# Patient Record
Sex: Female | Born: 1980
Health system: Southern US, Community
[De-identification: ages and names within clinical notes are randomized; demographics above are authoritative.]

## PROBLEM LIST (undated history)

## (undated) DIAGNOSIS — F329 Major depressive disorder, single episode, unspecified: Secondary | ICD-10-CM

## (undated) DIAGNOSIS — F32A Depression, unspecified: Secondary | ICD-10-CM

## (undated) DIAGNOSIS — F988 Other specified behavioral and emotional disorders with onset usually occurring in childhood and adolescence: Secondary | ICD-10-CM

## (undated) HISTORY — DX: Major depressive disorder, single episode, unspecified: F32.9

## (undated) HISTORY — DX: Other specified behavioral and emotional disorders with onset usually occurring in childhood and adolescence: F98.8

## (undated) HISTORY — DX: Depression, unspecified: F32.A

---
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
## 2015-08-06 DIAGNOSIS — Z01419 Encounter for gynecological examination (general) (routine) without abnormal findings: Secondary | ICD-10-CM | POA: Diagnosis not present

## 2015-08-30 DIAGNOSIS — J329 Chronic sinusitis, unspecified: Secondary | ICD-10-CM | POA: Diagnosis not present

## 2015-09-06 DIAGNOSIS — H10413 Chronic giant papillary conjunctivitis, bilateral: Secondary | ICD-10-CM | POA: Diagnosis not present

## 2016-02-25 ENCOUNTER — Encounter: Payer: Self-pay | Admitting: Physician Assistant

## 2016-02-25 ENCOUNTER — Encounter (INDEPENDENT_AMBULATORY_CARE_PROVIDER_SITE_OTHER): Payer: Self-pay

## 2016-02-25 ENCOUNTER — Ambulatory Visit (INDEPENDENT_AMBULATORY_CARE_PROVIDER_SITE_OTHER): Payer: Medicare Other | Admitting: Physician Assistant

## 2016-02-25 VITALS — BP 129/92 | HR 86 | Temp 98.4°F | Ht 60.0 in | Wt 145.6 lb

## 2016-02-25 DIAGNOSIS — N946 Dysmenorrhea, unspecified: Secondary | ICD-10-CM

## 2016-02-25 DIAGNOSIS — F988 Other specified behavioral and emotional disorders with onset usually occurring in childhood and adolescence: Secondary | ICD-10-CM | POA: Insufficient documentation

## 2016-02-25 DIAGNOSIS — F329 Major depressive disorder, single episode, unspecified: Secondary | ICD-10-CM

## 2016-02-25 DIAGNOSIS — F32A Depression, unspecified: Secondary | ICD-10-CM | POA: Insufficient documentation

## 2016-02-25 MED ORDER — METHYLPHENIDATE HCL ER (OSM) 18 MG PO TBCR: 18.0000 mg | EXTENDED_RELEASE_TABLET | Freq: Every day | ORAL | 0 refills | Status: DC

## 2016-02-25 NOTE — Patient Instructions (Signed)

## 2016-02-25 NOTE — Progress Notes (Signed)
BP (!) 129/92   Pulse 86   Temp 98.4 F (36.9 C) (Oral)   Ht 5' (1.524 m)   Wt 145 lb 9.6 oz (66 kg)   BMI 28.44 kg/m    Subjective:    Patient ID: Kayla Shepherd, female    DOB: 1980/06/27, 35 y.o.   MRN: [id number]  Kayla Shepherd is a 35 y.o. female presenting on 02/25/2016 for Medication follow up  HPI Patient here to be established as new patient at [facility].  This patient is known to me from [facility]. This patient comes in for recheck on her medications. Overall she is doing well. She is known to me from previous office. She is not having any complaints today. All of her medications and conditions are reviewed. She'll be going out of town for the next 2 days and like to return next week to have her flu shot. That is fine to come back in and have it done.   Relevant past medical, surgical, family and social history reviewed and updated as indicated. Interim medical history since our last visit reviewed. Allergies and medications reviewed and updated.   Data reviewed from any sources in EPIC.  Review of Systems  Constitutional: Negative.  Negative for activity change, fatigue and fever.  HENT: Negative.   Eyes: Negative.   Respiratory: Negative.  Negative for cough.   Cardiovascular: Negative.  Negative for chest pain.  Gastrointestinal: Negative.  Negative for abdominal pain.  Endocrine: Negative.   Genitourinary: Negative.  Negative for dysuria.  Musculoskeletal: Negative.   Skin: Negative.   Neurological: Negative.   Psychiatric/Behavioral: Positive for decreased concentration and dysphoric mood. The patient is nervous/anxious.      Social History   Social History  . Marital status: Single    Spouse name: N/A  . Number of children: N/A  . Years of education: N/A   Occupational History  . Not on file.   Social History Main Topics  . Smoking status: Never Smoker  . Smokeless tobacco: Never Used  . Alcohol use No    . Drug use: No  . Sexual activity: Not on file   Other Topics Concern  . Not on file   Social History Narrative  . No narrative on file    History reviewed. No pertinent surgical history.  History reviewed. No pertinent family history.    Medication List       Accurate as of 02/25/16 10:02 PM. Always use your most recent med list.          buPROPion 300 MG 24 hr tablet Commonly known as:  WELLBUTRIN XL   levonorgestrel-ethinyl estradiol 90-20 MCG tablet Commonly known as:  LYBREL,AMETHYST   LORazepam 0.5 MG tablet Commonly known as:  ATIVAN Take 0.5 mg by mouth 2 (two) times daily as needed for anxiety.   methylphenidate 18 MG CR tablet Commonly known as:  CONCERTA Take 1 tablet (18 mg total) by mouth daily.   methylphenidate 18 MG CR tablet Commonly known as:  CONCERTA Take 1 tablet (18 mg total) by mouth daily.   methylphenidate 18 MG CR tablet Commonly known as:  CONCERTA Take 1 tablet (18 mg total) by mouth daily.          Objective:    BP (!) 129/92   Pulse 86   Temp 98.4 F (36.9 C) (Oral)   Ht 5' (1.524 m)   Wt 145 lb 9.6 oz (66 kg)   BMI  28.44 kg/m   No Known Allergies Wt Readings from Last 3 Encounters:  02/25/16 145 lb 9.6 oz (66 kg)    Physical Exam  Constitutional: She is oriented to person, place, and time. She appears well-developed and well-nourished.  HENT:  Head: Normocephalic and atraumatic.  Eyes: Conjunctivae and EOM are normal. Pupils are equal, round, and reactive to light.  Neck: Normal range of motion. Neck supple.  Cardiovascular: Normal rate, regular rhythm, normal heart sounds and intact distal pulses.   Pulmonary/Chest: Effort normal and breath sounds normal.  Abdominal: Soft. Bowel sounds are normal.  Neurological: She is alert and oriented to person, place, and time. She has normal reflexes.  Skin: Skin is warm and dry. No rash noted.  Psychiatric: She has a normal mood and affect. Her behavior is normal.  Judgment and thought content normal.      Assessment & Plan:   1. Attention deficit disorder, unspecified hyperactivity presence - methylphenidate 18 MG PO CR tablet; Take 1 tablet (18 mg total) by mouth daily.  Dispense: 30 tablet; Refill: 0 - methylphenidate (CONCERTA) 18 MG PO CR tablet; Take 1 tablet (18 mg total) by mouth daily.  Dispense: 30 tablet; Refill: 0 - methylphenidate (CONCERTA) 18 MG PO CR tablet; Take 1 tablet (18 mg total) by mouth daily.  Dispense: 30 tablet; Refill: 0  2. Depression, unspecified depression type  3. Dysmenorrhea Continue Lybrel   Continue all other maintenance medications as listed above. Educational handout given for ADHD  Follow up plan: Return in about 3 months (around 05/27/2016).  Terald Sleeper PA-C [facility] [address]  [place], [place] [address] [phone]   02/25/2016, 10:02 PM

## 2016-03-26 ENCOUNTER — Ambulatory Visit (INDEPENDENT_AMBULATORY_CARE_PROVIDER_SITE_OTHER): Payer: Medicare Other

## 2016-03-26 DIAGNOSIS — Z23 Encounter for immunization: Secondary | ICD-10-CM | POA: Diagnosis not present

## 2016-07-04 ENCOUNTER — Other Ambulatory Visit: Payer: Self-pay | Admitting: Physician Assistant

## 2016-07-04 DIAGNOSIS — F988 Other specified behavioral and emotional disorders with onset usually occurring in childhood and adolescence: Secondary | ICD-10-CM

## 2016-07-09 ENCOUNTER — Encounter: Payer: Self-pay | Admitting: Physician Assistant

## 2016-07-09 ENCOUNTER — Ambulatory Visit (INDEPENDENT_AMBULATORY_CARE_PROVIDER_SITE_OTHER): Payer: Medicare Other | Admitting: Physician Assistant

## 2016-07-09 VITALS — BP 115/89 | HR 98 | Temp 97.4°F | Ht 60.0 in | Wt 141.2 lb

## 2016-07-09 DIAGNOSIS — F901 Attention-deficit hyperactivity disorder, predominantly hyperactive type: Secondary | ICD-10-CM

## 2016-07-09 DIAGNOSIS — F419 Anxiety disorder, unspecified: Secondary | ICD-10-CM | POA: Insufficient documentation

## 2016-07-09 DIAGNOSIS — F988 Other specified behavioral and emotional disorders with onset usually occurring in childhood and adolescence: Secondary | ICD-10-CM

## 2016-07-09 MED ORDER — LORAZEPAM 0.5 MG PO TABS: 0.5000 mg | ORAL_TABLET | Freq: Two times a day (BID) | ORAL | 5 refills | Status: DC | PRN

## 2016-07-09 MED ORDER — METHYLPHENIDATE HCL ER (OSM) 18 MG PO TBCR: 18.0000 mg | EXTENDED_RELEASE_TABLET | Freq: Every day | ORAL | 0 refills | Status: DC

## 2016-07-09 NOTE — Patient Instructions (Signed)
In a few days you may receive a survey in the mail or online from [employer] regarding your visit with us today. Please take a moment to fill this out. Your feedback is very important to our whole office. It can help us better understand your needs as well as improve your experience and satisfaction. Thank you for taking your time to complete it. We care about you.  Melannie Metzner, PA-C  

## 2016-07-10 NOTE — Progress Notes (Signed)
BP 115/89   Pulse 98   Temp 97.4 F (36.3 C) (Oral)   Ht 5' (1.524 m)   Wt 141 lb 3.2 oz (64 kg)   BMI 27.58 kg/m    Subjective:    Patient ID: Kayla Shepherd, female    DOB: [birth year], 36 y.o.   MRN: [id number]  HPI: Kayla Shepherd is a 36 y.o. female presenting on 07/09/2016 for Medication Refill  This patient comes in for periodic recheck on medications and conditions including ADHD, anxiety.   All medications are reviewed today. There are no reports of any problems with the medications. All of the medical conditions are reviewed and updated.  Lab work is reviewed and will be ordered as medically necessary. There are no new problems reported with today's visit.   Relevant past medical, surgical, family and social history reviewed and updated as indicated. Allergies and medications reviewed and updated.  Past Medical History:  Diagnosis Date  . ADD (attention deficit disorder)   . Depression     History reviewed. No pertinent surgical history.  Review of Systems  Constitutional: Negative.  Negative for activity change, fatigue and fever.  HENT: Negative.   Eyes: Negative.   Respiratory: Negative.  Negative for cough.   Cardiovascular: Negative.  Negative for chest pain.  Gastrointestinal: Negative.  Negative for abdominal pain.  Endocrine: Negative.   Genitourinary: Negative.  Negative for dysuria.  Musculoskeletal: Negative.   Skin: Negative.   Neurological: Negative.     Allergies as of 07/09/2016   No Known Allergies     Medication List       Accurate as of 07/09/16 11:59 PM. Always use your most recent med list.          buPROPion 300 MG 24 hr tablet Commonly known as:  WELLBUTRIN XL   levonorgestrel-ethinyl estradiol 90-20 MCG tablet Commonly known as:  LYBREL,AMETHYST   LORazepam 0.5 MG tablet Commonly known as:  ATIVAN Take 1 tablet (0.5 mg total) by mouth 2 (two) times daily as needed for anxiety.   methylphenidate 18 MG CR  tablet Commonly known as:  CONCERTA Take 1 tablet (18 mg total) by mouth daily.   methylphenidate 18 MG CR tablet Commonly known as:  CONCERTA Take 1 tablet (18 mg total) by mouth daily.   methylphenidate 18 MG CR tablet Commonly known as:  CONCERTA Take 1 tablet (18 mg total) by mouth daily.          Objective:    BP 115/89   Pulse 98   Temp 97.4 F (36.3 C) (Oral)   Ht 5' (1.524 m)   Wt 141 lb 3.2 oz (64 kg)   BMI 27.58 kg/m   No Known Allergies  Physical Exam  Constitutional: She is oriented to person, place, and time. She appears well-developed and well-nourished.  HENT:  Head: Normocephalic and atraumatic.  Right Ear: Tympanic membrane, external ear and ear canal normal.  Left Ear: Tympanic membrane, external ear and ear canal normal.  Nose: Nose normal. No rhinorrhea.  Mouth/Throat: Oropharynx is clear and moist and mucous membranes are normal. No oropharyngeal exudate or posterior oropharyngeal erythema.  Eyes: Conjunctivae and EOM are normal. Pupils are equal, round, and reactive to light.  Neck: Normal range of motion. Neck supple.  Cardiovascular: Normal rate, regular rhythm, normal heart sounds and intact distal pulses.   Pulmonary/Chest: Effort normal and breath sounds normal.  Abdominal: Soft. Bowel sounds are normal.  Neurological: She is alert and oriented to  person, place, and time. She has normal reflexes.  Skin: Skin is warm and dry. No rash noted.  Psychiatric: She has a normal mood and affect. Her behavior is normal. Judgment and thought content normal.    No results found for this or any previous visit.    Assessment & Plan:   1. Attention deficit disorder, unspecified hyperactivity presence - methylphenidate 18 MG PO CR tablet; Take 1 tablet (18 mg total) by mouth daily.  Dispense: 30 tablet; Refill: 0 - methylphenidate (CONCERTA) 18 MG PO CR tablet; Take 1 tablet (18 mg total) by mouth daily.  Dispense: 30 tablet; Refill: 0 - methylphenidate  (CONCERTA) 18 MG PO CR tablet; Take 1 tablet (18 mg total) by mouth daily.  Dispense: 30 tablet; Refill: 0  2. Anxiety - LORazepam (ATIVAN) 0.5 MG tablet; Take 1 tablet (0.5 mg total) by mouth 2 (two) times daily as needed for anxiety.  Dispense: 30 tablet; Refill: 5   Continue all other maintenance medications as listed above.  Follow up plan: Return in about 3 months (around 10/06/2016) for recheck meds.  Educational handout given for ADHD  Terald Sleeper PA-C [facility] [address]  [place], [place] [address] [phone]   07/10/2016, 1:10 PM

## 2016-07-16 ENCOUNTER — Other Ambulatory Visit: Payer: Self-pay | Admitting: Physician Assistant

## 2016-07-16 MED ORDER — DESOGESTREL-ETHINYL ESTRADIOL 0.15-0.02/0.01 MG (21/5) PO TABS: 1.0000 | ORAL_TABLET | Freq: Every day | ORAL | 11 refills | Status: DC

## 2016-07-16 NOTE — Progress Notes (Signed)
Patients mother aware  

## 2016-08-26 ENCOUNTER — Other Ambulatory Visit: Payer: Self-pay | Admitting: Physician Assistant

## 2016-09-29 ENCOUNTER — Other Ambulatory Visit: Payer: Self-pay | Admitting: Physician Assistant

## 2016-09-29 DIAGNOSIS — F419 Anxiety disorder, unspecified: Secondary | ICD-10-CM

## 2016-09-30 ENCOUNTER — Other Ambulatory Visit: Payer: Self-pay | Admitting: Physician Assistant

## 2016-09-30 DIAGNOSIS — F419 Anxiety disorder, unspecified: Secondary | ICD-10-CM

## 2016-09-30 MED ORDER — LORAZEPAM 0.5 MG PO TABS: ORAL_TABLET | ORAL | 2 refills | Status: DC

## 2016-09-30 NOTE — Progress Notes (Signed)
rx called into pharmacy

## 2016-10-07 ENCOUNTER — Ambulatory Visit (INDEPENDENT_AMBULATORY_CARE_PROVIDER_SITE_OTHER): Payer: Medicare Other | Admitting: Physician Assistant

## 2016-10-07 ENCOUNTER — Encounter: Payer: Self-pay | Admitting: Physician Assistant

## 2016-10-07 VITALS — BP 120/88 | HR 92 | Temp 98.1°F | Ht 60.0 in | Wt 141.8 lb

## 2016-10-07 DIAGNOSIS — F988 Other specified behavioral and emotional disorders with onset usually occurring in childhood and adolescence: Secondary | ICD-10-CM

## 2016-10-07 DIAGNOSIS — Z0000 Encounter for general adult medical examination without abnormal findings: Secondary | ICD-10-CM

## 2016-10-07 DIAGNOSIS — F908 Attention-deficit hyperactivity disorder, other type: Secondary | ICD-10-CM

## 2016-10-07 MED ORDER — BUPROPION HCL ER (XL) 300 MG PO TB24: 300.0000 mg | ORAL_TABLET | Freq: Every day | ORAL | 11 refills | Status: DC

## 2016-10-07 MED ORDER — METHYLPHENIDATE HCL ER (OSM) 18 MG PO TBCR: 18.0000 mg | EXTENDED_RELEASE_TABLET | Freq: Every day | ORAL | 0 refills | Status: DC

## 2016-10-07 NOTE — Progress Notes (Signed)
BP 120/88   Pulse 92   Temp 98.1 F (36.7 C) (Oral)   Ht 5' (1.524 m)   Wt 141 lb 12.8 oz (64.3 kg)   BMI 27.69 kg/m    Subjective:    Patient ID: Kayla Shepherd, female    DOB: 04-02-1981, 36 y.o.   MRN: [id number]  HPI: Kayla Shepherd is a 36 y.o. female presenting on 10/07/2016 for Follow-up and Medication Refill  This patient comes in for periodic recheck on medications and conditions including ADHD. Reports meds are doing well and no changes needed. She has been stable for 15 years on this medication.  This is her 3 month recheck for controlled med.  All medications are reviewed today. There are no reports of any problems with the medications. All of the medical conditions are reviewed and updated.  Lab work is reviewed and will be ordered as medically necessary. There are no new problems reported with today's visit.   Relevant past medical, surgical, family and social history reviewed and updated as indicated. Allergies and medications reviewed and updated.  Past Medical History:  Diagnosis Date  . ADD (attention deficit disorder)   . Depression     History reviewed. No pertinent surgical history.  Review of Systems  Constitutional: Negative.  Negative for activity change, fatigue and fever.  HENT: Negative.   Eyes: Negative.   Respiratory: Negative.  Negative for cough.   Cardiovascular: Negative.  Negative for chest pain.  Gastrointestinal: Negative.  Negative for abdominal pain.  Endocrine: Negative.   Genitourinary: Negative.  Negative for dysuria.  Musculoskeletal: Negative.   Skin: Negative.   Neurological: Negative.     Allergies as of 10/07/2016   No Known Allergies     Medication List       Accurate as of 10/07/16 10:32 AM. Always use your most recent med list.          buPROPion 300 MG 24 hr tablet Commonly known as:  WELLBUTRIN XL Take 1 tablet (300 mg total) by mouth daily.   desogestrel-ethinyl estradiol 0.15-0.02/0.01 MG (21/5)  tablet Commonly known as:  KARIVA,AZURETTE,MIRCETTE Take 1 tablet by mouth daily.   LORazepam 0.5 MG tablet Commonly known as:  ATIVAN TAKE 1 TABLET TWICE DAILY AS NEEDED FOR ANXIETY   methylphenidate 18 MG CR tablet Commonly known as:  CONCERTA Take 1 tablet (18 mg total) by mouth daily.   methylphenidate 18 MG CR tablet Commonly known as:  CONCERTA Take 1 tablet (18 mg total) by mouth daily.   methylphenidate 18 MG CR tablet Commonly known as:  CONCERTA Take 1 tablet (18 mg total) by mouth daily.          Objective:    BP 120/88   Pulse 92   Temp 98.1 F (36.7 C) (Oral)   Ht 5' (1.524 m)   Wt 141 lb 12.8 oz (64.3 kg)   BMI 27.69 kg/m   No Known Allergies  Physical Exam  Constitutional: She is oriented to person, place, and time. She appears well-developed and well-nourished.  HENT:  Head: Normocephalic and atraumatic.  Right Ear: Tympanic membrane, external ear and ear canal normal.  Left Ear: Tympanic membrane, external ear and ear canal normal.  Nose: Nose normal. No rhinorrhea.  Mouth/Throat: Oropharynx is clear and moist and mucous membranes are normal. No oropharyngeal exudate or posterior oropharyngeal erythema.  Eyes: Conjunctivae and EOM are normal. Pupils are equal, round, and reactive to light.  Neck: Normal range of motion. Neck  supple.  Cardiovascular: Normal rate, regular rhythm, normal heart sounds and intact distal pulses.   Pulmonary/Chest: Effort normal and breath sounds normal.  Abdominal: Soft. Bowel sounds are normal.  Neurological: She is alert and oriented to person, place, and time. She has normal reflexes.  Skin: Skin is warm and dry. No rash noted.  Psychiatric: She has a normal mood and affect. Her behavior is normal. Judgment and thought content normal.  Nursing note and vitals reviewed.   No results found for this or any previous visit.    Assessment & Plan:   1. Attention deficit disorder, unspecified hyperactivity presence -  methylphenidate 18 MG PO CR tablet; Take 1 tablet (18 mg total) by mouth daily.  Dispense: 30 tablet; Refill: 0 - methylphenidate (CONCERTA) 18 MG PO CR tablet; Take 1 tablet (18 mg total) by mouth daily.  Dispense: 30 tablet; Refill: 0 - methylphenidate (CONCERTA) 18 MG PO CR tablet; Take 1 tablet (18 mg total) by mouth daily.  Dispense: 30 tablet; Refill: 0  2. Well adult exam Not performed, here for lab order - CMP14+EGFR - CBC with Differential/Platelet - Lipid panel   Continue all other maintenance medications as listed above.  Follow up plan: Return in about 3 months (around 01/07/2017) for recheck.  Educational handout given for [place] PA-C [facility] [address]  [place], [place] [address] [phone]   10/07/2016, 10:32 AM

## 2016-10-07 NOTE — Patient Instructions (Signed)
In a few days you may receive a survey in the mail or online from [employer] regarding your visit with us today. Please take a moment to fill this out. Your feedback is very important to our whole office. It can help us better understand your needs as well as improve your experience and satisfaction. Thank you for taking your time to complete it. We care about you.  Tawsha Terrero, PA-C  

## 2016-10-08 LAB — LIPID PANEL
CHOL/HDL RATIO: 4.7 ratio — AB (ref 0.0–4.4)
Cholesterol, Total: 200 mg/dL — ABNORMAL HIGH (ref 100–199)
HDL: 43 mg/dL (ref >39)
LDL CALC: 131 mg/dL — AB (ref 0–99)
Triglycerides: 131 mg/dL (ref 0–149)
VLDL Cholesterol Cal: 26 mg/dL (ref 5–40)

## 2016-10-08 LAB — CMP14+EGFR
ALBUMIN: 4.5 g/dL (ref 3.5–5.5)
ALK PHOS: 80 IU/L (ref 39–117)
ALT: 13 IU/L (ref 0–32)
AST: 18 IU/L (ref 0–40)
Albumin/Globulin Ratio: 1.6 (ref 1.2–2.2)
BUN / CREAT RATIO: 10 (ref 9–23)
BUN: 10 mg/dL (ref 6–20)
Bilirubin Total: 0.4 mg/dL (ref 0.0–1.2)
CO2: 23 mmol/L (ref 18–29)
CREATININE: 0.98 mg/dL (ref 0.57–1.00)
Calcium: 9.5 mg/dL (ref 8.7–10.2)
Chloride: 104 mmol/L (ref 96–106)
GFR calc non Af Amer: 76 mL/min/{1.73_m2} (ref >59)
GFR, EST AFRICAN AMERICAN: 87 mL/min/{1.73_m2} (ref >59)
GLOBULIN, TOTAL: 2.8 g/dL (ref 1.5–4.5)
Glucose: 99 mg/dL (ref 65–99)
Potassium: 4.2 mmol/L (ref 3.5–5.2)
SODIUM: 143 mmol/L (ref 134–144)
TOTAL PROTEIN: 7.3 g/dL (ref 6.0–8.5)

## 2016-10-08 LAB — CBC WITH DIFFERENTIAL/PLATELET
BASOS: 1 %
Basophils Absolute: 0 10*3/uL (ref 0.0–0.2)
EOS (ABSOLUTE): 0.1 10*3/uL (ref 0.0–0.4)
EOS: 1 %
HEMATOCRIT: 41.1 % (ref 34.0–46.6)
HEMOGLOBIN: 13.8 g/dL (ref 11.1–15.9)
Immature Grans (Abs): 0 10*3/uL (ref 0.0–0.1)
Immature Granulocytes: 0 %
LYMPHS ABS: 2.4 10*3/uL (ref 0.7–3.1)
Lymphs: 34 %
MCH: 31.7 pg (ref 26.6–33.0)
MCHC: 33.6 g/dL (ref 31.5–35.7)
MCV: 95 fL (ref 79–97)
MONOS ABS: 0.4 10*3/uL (ref 0.1–0.9)
Monocytes: 6 %
NEUTROS ABS: 4.1 10*3/uL (ref 1.4–7.0)
Neutrophils: 58 %
Platelets: 359 10*3/uL (ref 150–379)
RBC: 4.35 x10E6/uL (ref 3.77–5.28)
RDW: 13 % (ref 12.3–15.4)
WBC: 7.1 10*3/uL (ref 3.4–10.8)

## 2016-11-18 ENCOUNTER — Ambulatory Visit: Payer: Medicare Other

## 2017-01-05 ENCOUNTER — Encounter: Payer: Self-pay | Admitting: Pediatrics

## 2017-01-05 ENCOUNTER — Ambulatory Visit (INDEPENDENT_AMBULATORY_CARE_PROVIDER_SITE_OTHER): Payer: Medicare Other | Admitting: Pediatrics

## 2017-01-05 VITALS — BP 139/87 | HR 126 | Temp 102.3°F | Ht 60.0 in | Wt 138.2 lb

## 2017-01-05 DIAGNOSIS — J069 Acute upper respiratory infection, unspecified: Secondary | ICD-10-CM

## 2017-01-05 DIAGNOSIS — R05 Cough: Secondary | ICD-10-CM

## 2017-01-05 DIAGNOSIS — R0981 Nasal congestion: Secondary | ICD-10-CM | POA: Diagnosis not present

## 2017-01-05 DIAGNOSIS — J029 Acute pharyngitis, unspecified: Secondary | ICD-10-CM

## 2017-01-05 DIAGNOSIS — R509 Fever, unspecified: Secondary | ICD-10-CM

## 2017-01-05 DIAGNOSIS — R059 Cough, unspecified: Secondary | ICD-10-CM

## 2017-01-05 LAB — VERITOR FLU A/B WAIVED
INFLUENZA B: NEGATIVE
Influenza A: NEGATIVE

## 2017-01-05 LAB — CULTURE, GROUP A STREP

## 2017-01-05 LAB — RAPID STREP SCREEN (MED CTR MEBANE ONLY): Strep Gp A Ag, IA W/Reflex: NEGATIVE

## 2017-01-05 NOTE — Patient Instructions (Signed)
Nasal congestion: Netipot with distilled water 2-3 times a day to clear out sinuses  Or Normal saline nasal spray Flonase steroid nasal spray  For sore throat can use: Ibuprofen 400- 600mg  three times a day Throat lozenges chloroseptic spray  Stick with bland foods Drink lots of fluids

## 2017-01-05 NOTE — Progress Notes (Signed)
  Subjective:   Patient ID: Kayla Shepherd, female    DOB: [birth year], 36 y.o.   MRN: [id number] CC: Nasal Congestion; Cough; and Sore Throat  HPI: Kayla Shepherd is a 36 y.o. female presenting for Nasal Congestion; Cough; and Sore Throat  Started yesterday Lives with parents Has not been feeling well starting yesterday Appetite down Fever today Some cough, some runny nose Other family members have also been sick with URI symptoms Not been outside recently, no known tick bites  Not taking NSAIDs/tylenol regularly  Relevant past medical, surgical, family and social history reviewed. Allergies and medications reviewed and updated. History  Smoking Status  . Never Smoker  Smokeless Tobacco  . Never Used   ROS: Per HPI   Objective:    BP 139/87   Pulse (!) 126   Temp (!) 102.3 F (39.1 C) (Oral)   Ht 5' (1.524 m)   Wt 138 lb 3.2 oz (62.7 kg)   BMI 26.99 kg/m   Wt Readings from Last 3 Encounters:  01/05/17 138 lb 3.2 oz (62.7 kg)  10/07/16 141 lb 12.8 oz (64.3 kg)  07/09/16 141 lb 3.2 oz (64 kg)    Gen: NAD, alert, tired appearing, cooperative with exam, NCAT, congested EYES: EOMI, no conjunctival injection, or no icterus ENT:  TMs pearly gray b/l, OP with mild erythema LYMPH: small < 0.5cm ant cervical LAD CV: tachycardic, RR, normal S1/S2, no murmur, distal pulses 2+ b/l Resp: CTABL, no wheezes, normal WOB Abd: +BS, soft, NTND. no guarding or organomegaly Ext: No edema, warm Neuro: Alert MSK: normal muscle bulk, normal ROM of neck, no stiffness or pain Skin: no rash  Assessment & Plan:  Kayla Shepherd was seen today for nasal congestion, cough and sore throat.  Diagnoses and all orders for this visit:  Acute URI Fever today in clinic, symptoms started yesterday Flu neg, strep neg Will f/u culture Discussed symptomatic care, return precautions If fever continues needs to be seen  Sore throat -     Veritor Flu A/B Waived -     Rapid strep screen (not at  [facility]) -     Culture, Group A Strep  Fever, unspecified fever cause -     Veritor Flu A/B Waived -     Rapid strep screen (not at [facility]) -     Culture, Group A Strep  Cough -     Veritor Flu A/B Waived  Head congestion -     Veritor Flu A/B Waived  Other orders -     Culture, Group A Strep   Follow up plan: prn Assunta Found, MD [facility];l

## 2017-01-09 ENCOUNTER — Ambulatory Visit (INDEPENDENT_AMBULATORY_CARE_PROVIDER_SITE_OTHER): Payer: Medicare Other | Admitting: Physician Assistant

## 2017-01-09 ENCOUNTER — Encounter: Payer: Self-pay | Admitting: Physician Assistant

## 2017-01-09 DIAGNOSIS — F909 Attention-deficit hyperactivity disorder, unspecified type: Secondary | ICD-10-CM

## 2017-01-09 DIAGNOSIS — F988 Other specified behavioral and emotional disorders with onset usually occurring in childhood and adolescence: Secondary | ICD-10-CM

## 2017-01-09 DIAGNOSIS — F419 Anxiety disorder, unspecified: Secondary | ICD-10-CM

## 2017-01-09 LAB — CULTURE, GROUP A STREP: STREP A CULTURE: NEGATIVE

## 2017-01-09 MED ORDER — METHYLPHENIDATE HCL ER (OSM) 18 MG PO TBCR: 18.0000 mg | EXTENDED_RELEASE_TABLET | Freq: Every day | ORAL | 0 refills | Status: DC

## 2017-01-09 MED ORDER — LORAZEPAM 0.5 MG PO TABS: ORAL_TABLET | ORAL | 2 refills | Status: DC

## 2017-01-09 MED ORDER — HYDROCODONE-HOMATROPINE 5-1.5 MG/5ML PO SYRP: 5.0000 mL | ORAL_SOLUTION | Freq: Four times a day (QID) | ORAL | 0 refills | Status: DC | PRN

## 2017-01-09 NOTE — Patient Instructions (Signed)
In a few days you may receive a survey in the mail or online from [employer] regarding your visit with us today. Please take a moment to fill this out. Your feedback is very important to our whole office. It can help us better understand your needs as well as improve your experience and satisfaction. Thank you for taking your time to complete it. We care about you.  Jaylin Benzel, PA-C  

## 2017-01-12 NOTE — Progress Notes (Signed)
BP 135/83   Pulse 99   Temp 99 F (37.2 C) (Oral)   Ht 5' (1.524 m)   Wt 139 lb 12.8 oz (63.4 kg)   BMI 27.30 kg/m    Subjective:    Patient ID: Kayla Shepherd, female    DOB: 14-Sep-1980, 36 y.o.   MRN: [id number]  HPI: Kayla Shepherd is a 36 y.o. female presenting on 01/09/2017 for Follow-up (3 month rck )  This patient comes in for periodic recheck on medications and conditions including ADD and anxiety. She recently has a bad cold and some residual cough is present.  There has been no problems with her medications.   All medications are reviewed today. There are no reports of any problems with the medications. All of the medical conditions are reviewed and updated.  Lab work is reviewed and will be ordered as medically necessary. There are no new problems reported with today's visit.   Relevant past medical, surgical, family and social history reviewed and updated as indicated. Allergies and medications reviewed and updated.  Past Medical History:  Diagnosis Date  . ADD (attention deficit disorder)   . Depression     History reviewed. No pertinent surgical history.  Review of Systems  Constitutional: Negative.  Negative for activity change, fatigue and fever.  HENT: Negative.   Eyes: Negative.   Respiratory: Positive for cough.   Cardiovascular: Negative.  Negative for chest pain.  Gastrointestinal: Negative.  Negative for abdominal pain.  Endocrine: Negative.   Genitourinary: Negative.  Negative for dysuria.  Musculoskeletal: Negative.   Skin: Negative.   Neurological: Negative.     Allergies as of 01/09/2017   No Known Allergies     Medication List       Accurate as of 01/09/17 11:59 PM. Always use your most recent med list.          buPROPion 300 MG 24 hr tablet Commonly known as:  WELLBUTRIN XL Take 1 tablet (300 mg total) by mouth daily.   desogestrel-ethinyl estradiol 0.15-0.02/0.01 MG (21/5) tablet Commonly known as:   KARIVA,AZURETTE,MIRCETTE Take 1 tablet by mouth daily.   HYDROcodone-homatropine 5-1.5 MG/5ML syrup Commonly known as:  HYCODAN Take 5-10 mLs by mouth every 6 (six) hours as needed.   LORazepam 0.5 MG tablet Commonly known as:  ATIVAN TAKE 1 TABLET TWICE DAILY AS NEEDED FOR ANXIETY   methylphenidate 18 MG CR tablet Commonly known as:  CONCERTA Take 1 tablet (18 mg total) by mouth daily.   methylphenidate 18 MG CR tablet Commonly known as:  CONCERTA Take 1 tablet (18 mg total) by mouth daily.   methylphenidate 18 MG CR tablet Commonly known as:  CONCERTA Take 1 tablet (18 mg total) by mouth daily.            Discharge Care Instructions        Start     Ordered   01/09/17 0000  methylphenidate (CONCERTA) 18 MG PO CR tablet  Daily    Comments:  Fill 30 days from original script date  Question:  Supervising Provider  Answer:  Timmothy Euler   01/09/17 1522   01/09/17 0000  methylphenidate (CONCERTA) 18 MG PO CR tablet  Daily    Comments:  Fill 60 days from original script date  Question:  Supervising Provider  Answer:  Timmothy Euler   01/09/17 1522   01/09/17 0000  methylphenidate 18 MG PO CR tablet  Daily    Question:  Supervising Provider  Answer:  Timmothy Euler   01/09/17 1522   01/09/17 0000  LORazepam (ATIVAN) 0.5 MG tablet    Question:  Supervising Provider  Answer:  Timmothy Euler   01/09/17 1522   01/09/17 0000  HYDROcodone-homatropine (HYCODAN) 5-1.5 MG/5ML syrup  Every 6 hours PRN    Question:  Supervising Provider  Answer:  Timmothy Euler   01/09/17 1524         Objective:    BP 135/83   Pulse 99   Temp 99 F (37.2 C) (Oral)   Ht 5' (1.524 m)   Wt 139 lb 12.8 oz (63.4 kg)   BMI 27.30 kg/m   No Known Allergies  Physical Exam  Constitutional: She is oriented to person, place, and time. She appears well-developed and well-nourished.  HENT:  Head: Normocephalic and atraumatic.  Eyes: Pupils are equal, round, and reactive  to light. Conjunctivae and EOM are normal.  Cardiovascular: Normal rate, regular rhythm, normal heart sounds and intact distal pulses.   Pulmonary/Chest: Effort normal and breath sounds normal.  Abdominal: Soft. Bowel sounds are normal.  Neurological: She is alert and oriented to person, place, and time. She has normal reflexes.  Skin: Skin is warm and dry. No rash noted.  Psychiatric: She has a normal mood and affect. Her behavior is normal. Judgment and thought content normal.  Nursing note and vitals reviewed.   Results for orders placed or performed in visit on 01/05/17  Rapid strep screen (not at [facility])  Result Value Ref Range   Strep Gp A Ag, IA W/Reflex Negative Negative  Culture, Group A Strep  Result Value Ref Range   Strep A Culture Negative   Culture, Group A Strep  Result Value Ref Range   Strep A Culture CANCELED   Veritor Flu A/B Waived  Result Value Ref Range   Influenza A Negative Negative   Influenza B Negative Negative      Assessment & Plan:   1. Attention deficit disorder, unspecified hyperactivity presence - methylphenidate (CONCERTA) 18 MG PO CR tablet; Take 1 tablet (18 mg total) by mouth daily.  Dispense: 30 tablet; Refill: 0 - methylphenidate (CONCERTA) 18 MG PO CR tablet; Take 1 tablet (18 mg total) by mouth daily.  Dispense: 30 tablet; Refill: 0 - methylphenidate 18 MG PO CR tablet; Take 1 tablet (18 mg total) by mouth daily.  Dispense: 30 tablet; Refill: 0  2. Anxiety - LORazepam (ATIVAN) 0.5 MG tablet; TAKE 1 TABLET TWICE DAILY AS NEEDED FOR ANXIETY  Dispense: 30 tablet; Refill: 2    Current Outpatient Prescriptions:  .  buPROPion (WELLBUTRIN XL) 300 MG 24 hr tablet, Take 1 tablet (300 mg total) by mouth daily., Disp: 30 tablet, Rfl: 11 .  desogestrel-ethinyl estradiol (KARIVA,AZURETTE,MIRCETTE) 0.15-0.02/0.01 MG (21/5) tablet, Take 1 tablet by mouth daily., Disp: 1 Package, Rfl: 11 .  LORazepam (ATIVAN) 0.5 MG tablet, TAKE 1 TABLET TWICE DAILY AS  NEEDED FOR ANXIETY, Disp: 30 tablet, Rfl: 2 .  methylphenidate (CONCERTA) 18 MG PO CR tablet, Take 1 tablet (18 mg total) by mouth daily., Disp: 30 tablet, Rfl: 0 .  methylphenidate (CONCERTA) 18 MG PO CR tablet, Take 1 tablet (18 mg total) by mouth daily., Disp: 30 tablet, Rfl: 0 .  methylphenidate 18 MG PO CR tablet, Take 1 tablet (18 mg total) by mouth daily., Disp: 30 tablet, Rfl: 0 .  HYDROcodone-homatropine (HYCODAN) 5-1.5 MG/5ML syrup, Take 5-10 mLs by mouth every 6 (six) hours as needed., Disp: 240 mL,  Rfl: 0 Continue all other maintenance medications as listed above.  Follow up plan: Return in about 4 months (around 05/11/2017) for recheck.  Educational handout given for [place] PA-C [place] [address]  [place], [place] [address] [phone]   01/12/2017, 2:48 PM

## 2017-03-18 ENCOUNTER — Ambulatory Visit (INDEPENDENT_AMBULATORY_CARE_PROVIDER_SITE_OTHER): Payer: Medicare Other

## 2017-03-18 DIAGNOSIS — Z23 Encounter for immunization: Secondary | ICD-10-CM | POA: Diagnosis not present

## 2017-04-08 ENCOUNTER — Other Ambulatory Visit: Payer: Self-pay | Admitting: Physician Assistant

## 2017-04-10 NOTE — Telephone Encounter (Signed)
Called in.

## 2017-04-23 ENCOUNTER — Telehealth: Payer: Self-pay

## 2017-04-23 MED ORDER — METHYLPHENIDATE HCL ER (LA) 20 MG PO CP24: 20.0000 mg | ORAL_CAPSULE | ORAL | 0 refills | Status: DC

## 2017-04-23 NOTE — Telephone Encounter (Signed)
Insurance denied Concerta  Must try two alternatives which are Amphetamine Dextroamphetamine ER Methylphenidate tab;ets Dexmethylphenidate tablets

## 2017-04-23 NOTE — Addendum Note (Signed)
Addended by: Terald Sleeper on: 04/23/2017 04:12 PM   Modules accepted: Orders

## 2017-04-28 ENCOUNTER — Telehealth: Payer: Self-pay | Admitting: Physician Assistant

## 2017-04-28 NOTE — Telephone Encounter (Signed)
Can you please try to check on this.

## 2017-07-14 ENCOUNTER — Ambulatory Visit (INDEPENDENT_AMBULATORY_CARE_PROVIDER_SITE_OTHER): Payer: Medicare Other | Admitting: Family Medicine

## 2017-07-14 ENCOUNTER — Encounter: Payer: Self-pay | Admitting: Family Medicine

## 2017-07-14 VITALS — BP 121/77 | HR 102 | Temp 97.5°F | Ht 60.0 in | Wt 138.6 lb

## 2017-07-14 DIAGNOSIS — R059 Cough, unspecified: Secondary | ICD-10-CM

## 2017-07-14 DIAGNOSIS — R05 Cough: Secondary | ICD-10-CM | POA: Diagnosis not present

## 2017-07-14 MED ORDER — AZITHROMYCIN 250 MG PO TABS: ORAL_TABLET | ORAL | 0 refills | Status: DC

## 2017-07-14 MED ORDER — METHYLPREDNISOLONE ACETATE 80 MG/ML IJ SUSP
80.0000 mg | Freq: Once | INTRAMUSCULAR | Status: AC
  Administered 2017-07-14: 80 mg via INTRAMUSCULAR

## 2017-07-14 NOTE — Progress Notes (Signed)
   HPI  Patient presents today with cough.  Mother explains that patient has a memory problem has a difficult time giving her history.  Patient denies any facial pain or pressure, shortness of breath, or difficulty tolerating foods or fluids.  Mother states that they were both ill similarly about 2 weeks ago, that illness resolved after about 5 days, then 3-4 days ago she began to develop another illness including cough, congestion, sneezing, and temperature measured at 100 degrees.  She does seem to be eating and drinking normally.  She does not seem to have any increased work of breathing  PMH: Smoking status noted ROS: Per HPI  Objective: BP 121/77   Pulse (!) 102   Temp (!) 97.5 F (36.4 C) (Oral)   Ht 5' (1.524 m)   Wt 138 lb 9.6 oz (62.9 kg)   SpO2 99%   BMI 27.07 kg/m  Gen: NAD, alert, cooperative with exam HEENT: NCAT, pharynx moist and clear, right TM within normal TM obscured by cerumen, nares clear CV: RRR, good S1/S2, no murmur Resp: CTABL, no wheezes, non-labored Ext: No edema, warm Neuro: Alert and oriented, No gross deficits  Assessment and plan:  # Cough Given her elevated temperature and second sickness I think it is wise to go ahead and cover with azithromycin The patient and mother feel that she would also benefit from IM Depo-Medrol which was given. Supportive care otherwise Return to clinic with any concerns   Meds ordered this encounter  Medications  . methylPREDNISolone acetate (DEPO-MEDROL) injection 80 mg  . azithromycin (ZITHROMAX) 250 MG tablet    Sig: Take 2 tablets on day 1 and 1 tablet daily after that    Dispense:  6 tablet    Refill:  0    Laroy Apple, MD [facility] 07/14/2017, 1:18 PM

## 2017-07-30 ENCOUNTER — Other Ambulatory Visit: Payer: Self-pay | Admitting: Physician Assistant

## 2017-07-31 NOTE — Telephone Encounter (Signed)
Last seen 07/14/17  Dr Katheren Puller PCP

## 2017-08-24 ENCOUNTER — Other Ambulatory Visit: Payer: Self-pay | Admitting: Physician Assistant

## 2017-08-24 ENCOUNTER — Telehealth: Payer: Self-pay | Admitting: Physician Assistant

## 2017-08-24 NOTE — Telephone Encounter (Signed)
Appt made for rck for meds

## 2017-08-27 ENCOUNTER — Encounter: Payer: Self-pay | Admitting: Physician Assistant

## 2017-08-27 ENCOUNTER — Ambulatory Visit (INDEPENDENT_AMBULATORY_CARE_PROVIDER_SITE_OTHER): Payer: Medicare Other | Admitting: Physician Assistant

## 2017-08-27 VITALS — BP 123/89 | HR 107 | Temp 97.7°F | Ht 60.0 in | Wt 141.0 lb

## 2017-08-27 DIAGNOSIS — F988 Other specified behavioral and emotional disorders with onset usually occurring in childhood and adolescence: Secondary | ICD-10-CM | POA: Diagnosis not present

## 2017-08-27 DIAGNOSIS — F419 Anxiety disorder, unspecified: Secondary | ICD-10-CM

## 2017-08-27 MED ORDER — BUPROPION HCL ER (XL) 300 MG PO TB24: 300.0000 mg | ORAL_TABLET | Freq: Every day | ORAL | 3 refills | Status: DC

## 2017-08-27 MED ORDER — METHYLPHENIDATE HCL ER (LA) 20 MG PO CP24: 20.0000 mg | ORAL_CAPSULE | ORAL | 0 refills | Status: DC

## 2017-08-27 MED ORDER — METHYLPHENIDATE HCL ER (LA) 20 MG PO CP24: ORAL_CAPSULE | ORAL | 0 refills | Status: DC

## 2017-08-27 MED ORDER — LORAZEPAM 0.5 MG PO TABS: ORAL_TABLET | ORAL | 2 refills | Status: DC

## 2017-08-30 NOTE — Progress Notes (Signed)
BP 123/89   Pulse (!) 107   Temp 97.7 F (36.5 C) (Oral)   Ht 5' (1.524 m)   Wt 141 lb (64 kg)   BMI 27.54 kg/m    Subjective:    Patient ID: Kayla Shepherd, female    DOB: September 06, 1980, 37 y.o.   MRN: [id number]  HPI: Kayla Shepherd is a 37 y.o. female presenting on 08/27/2017 for Follow-up (6 month )  This patient comes in for periodic recheck on medications and conditions including depression with anxiety and ADHD.  She is doing very well her medications.  She has no complaints at this time.  Refills will be sent..   All medications are reviewed today. There are no reports of any problems with the medications. All of the medical conditions are reviewed and updated.  Lab work is reviewed and will be ordered as medically necessary. There are no new problems reported with today's visit.   Past Medical History:  Diagnosis Date  . ADD (attention deficit disorder)   . Depression    Relevant past medical, surgical, family and social history reviewed and updated as indicated. Interim medical history since our last visit reviewed. Allergies and medications reviewed and updated. DATA REVIEWED: CHART IN EPIC  Family History reviewed for pertinent findings.  Review of Systems  Constitutional: Negative.   HENT: Negative.   Eyes: Negative.   Respiratory: Negative.   Gastrointestinal: Negative.   Genitourinary: Negative.     Allergies as of 08/27/2017   No Known Allergies     Medication List        Accurate as of 08/27/17 11:59 PM. Always use your most recent med list.          buPROPion 300 MG 24 hr tablet Commonly known as:  WELLBUTRIN XL Take 1 tablet (300 mg total) by mouth daily.   LORazepam 0.5 MG tablet Commonly known as:  ATIVAN TAKE 1 TABLET TWICE DAILY AS NEEDED FOR ANXIETY   methylphenidate 20 MG 24 hr capsule Commonly known as:  RITALIN LA Take 1 capsule (20 mg total) by mouth every morning.   methylphenidate 20 MG 24 hr capsule Commonly known as:   RITALIN LA Take 1 capsule (20 mg total) by mouth every morning.   methylphenidate 20 MG 24 hr capsule Commonly known as:  RITALIN LA TAKE (1) CAPSULE DAILY IN THE MORNING.   VIORELE 0.15-0.02/0.01 MG (21/5) tablet Generic drug:  desogestrel-ethinyl estradiol TAKE 1 TABLET DAILY          Objective:    BP 123/89   Pulse (!) 107   Temp 97.7 F (36.5 C) (Oral)   Ht 5' (1.524 m)   Wt 141 lb (64 kg)   BMI 27.54 kg/m   No Known Allergies  Wt Readings from Last 3 Encounters:  08/27/17 141 lb (64 kg)  07/14/17 138 lb 9.6 oz (62.9 kg)  01/09/17 139 lb 12.8 oz (63.4 kg)    Physical Exam  Constitutional: She is oriented to person, place, and time. She appears well-developed and well-nourished.  HENT:  Head: Normocephalic and atraumatic.  Eyes: Pupils are equal, round, and reactive to light. Conjunctivae and EOM are normal.  Cardiovascular: Normal rate, regular rhythm, normal heart sounds and intact distal pulses.  Pulmonary/Chest: Effort normal and breath sounds normal.  Abdominal: Soft. Bowel sounds are normal.  Neurological: She is alert and oriented to person, place, and time. She has normal reflexes.  Skin: Skin is warm and dry. No rash noted.  Psychiatric: She has a normal mood and affect. Her behavior is normal. Judgment and thought content normal.        Assessment & Plan:   1. Anxiety - LORazepam (ATIVAN) 0.5 MG tablet; TAKE 1 TABLET TWICE DAILY AS NEEDED FOR ANXIETY  Dispense: 30 tablet; Refill: 2 - buPROPion (WELLBUTRIN XL) 300 MG 24 hr tablet; Take 1 tablet (300 mg total) by mouth daily.  Dispense: 90 tablet; Refill: 3  2. Attention deficit disorder, unspecified hyperactivity presence - methylphenidate (RITALIN LA) 20 MG 24 hr capsule; Take 1 capsule (20 mg total) by mouth every morning.  Dispense: 30 capsule; Refill: 0 - methylphenidate (RITALIN LA) 20 MG 24 hr capsule; Take 1 capsule (20 mg total) by mouth every morning.  Dispense: 30 capsule; Refill: 0 -  methylphenidate (RITALIN LA) 20 MG 24 hr capsule; TAKE (1) CAPSULE DAILY IN THE MORNING.  Dispense: 30 capsule; Refill: 0   Continue all other maintenance medications as listed above.  Follow up plan: Recheck 3-4 months  Educational handout given for [place] PA-C [place] [address]  [place], [place] [address] [phone]   08/30/2017, 11:16 PM

## 2017-09-07 DIAGNOSIS — H5213 Myopia, bilateral: Secondary | ICD-10-CM | POA: Diagnosis not present

## 2017-12-30 ENCOUNTER — Ambulatory Visit (INDEPENDENT_AMBULATORY_CARE_PROVIDER_SITE_OTHER): Payer: Medicare Other | Admitting: Physician Assistant

## 2017-12-30 ENCOUNTER — Encounter: Payer: Self-pay | Admitting: Physician Assistant

## 2017-12-30 VITALS — BP 124/86 | HR 88 | Temp 99.3°F | Ht 60.0 in | Wt 147.0 lb

## 2017-12-30 DIAGNOSIS — N946 Dysmenorrhea, unspecified: Secondary | ICD-10-CM

## 2017-12-30 DIAGNOSIS — F419 Anxiety disorder, unspecified: Secondary | ICD-10-CM | POA: Diagnosis not present

## 2017-12-30 DIAGNOSIS — F988 Other specified behavioral and emotional disorders with onset usually occurring in childhood and adolescence: Secondary | ICD-10-CM

## 2017-12-30 MED ORDER — METHYLPHENIDATE HCL ER (LA) 20 MG PO CP24: 20.0000 mg | ORAL_CAPSULE | ORAL | 0 refills | Status: DC

## 2017-12-30 MED ORDER — METHYLPHENIDATE HCL ER (LA) 20 MG PO CP24
ORAL_CAPSULE | ORAL | 0 refills | Status: DC
Start: 2017-12-30 — End: 2018-04-09

## 2017-12-30 MED ORDER — LORAZEPAM 0.5 MG PO TABS: ORAL_TABLET | ORAL | 5 refills | Status: DC

## 2017-12-30 MED ORDER — NORETHIN ACE-ETH ESTRAD-FE 1-20 MG-MCG PO TABS: 1.0000 | ORAL_TABLET | Freq: Every day | ORAL | 12 refills | Status: DC

## 2017-12-30 NOTE — Progress Notes (Signed)
BP 124/86   Pulse 88   Temp 99.3 F (37.4 C) (Oral)   Ht 5' (1.524 m)   Wt 147 lb (66.7 kg)   BMI 28.71 kg/m    Subjective:    Patient ID: Kayla Shepherd, female    DOB: 1980-07-13, 37 y.o.   MRN: [id number]  HPI: Kayla Shepherd is a 37 y.o. female presenting on 12/30/2017 for Medication Refill  This patient comes in for 69-month recheck of metabolic conditions she does have anxiety and ADHD.  She does need refills on these medications.  She states that her menstrual cycle is gotten worse again.  She had taken her previous birth control pill for quite some time.  I think we need to change it.   Past Medical History:  Diagnosis Date  . ADD (attention deficit disorder)   . Depression    Relevant past medical, surgical, family and social history reviewed and updated as indicated. Interim medical history since our last visit reviewed. Allergies and medications reviewed and updated. DATA REVIEWED: CHART IN EPIC  Family History reviewed for pertinent findings.  Review of Systems  Constitutional: Negative.  Negative for activity change, fatigue and fever.  HENT: Negative.   Eyes: Negative.   Respiratory: Negative.  Negative for cough.   Cardiovascular: Negative.  Negative for chest pain.  Gastrointestinal: Negative.  Negative for abdominal pain.  Endocrine: Negative.   Genitourinary: Negative.  Negative for dysuria.  Musculoskeletal: Negative.   Skin: Negative.   Neurological: Negative.     Allergies as of 12/30/2017   No Known Allergies     Medication List        Accurate as of 12/30/17 11:13 PM. Always use your most recent med list.          buPROPion 300 MG 24 hr tablet Commonly known as:  WELLBUTRIN XL Take 1 tablet (300 mg total) by mouth daily.   LORazepam 0.5 MG tablet Commonly known as:  ATIVAN TAKE 1 TABLET TWICE DAILY AS NEEDED FOR ANXIETY   methylphenidate 20 MG 24 hr capsule Commonly known as:  RITALIN LA Take 1 capsule (20 mg total) by mouth  every morning.   methylphenidate 20 MG 24 hr capsule Commonly known as:  RITALIN LA Take 1 capsule (20 mg total) by mouth every morning.   methylphenidate 20 MG 24 hr capsule Commonly known as:  RITALIN LA TAKE (1) CAPSULE DAILY IN THE MORNING.   norethindrone-ethinyl estradiol 1-20 MG-MCG tablet Commonly known as:  JUNEL FE,GILDESS FE,LOESTRIN FE Take 1 tablet by mouth daily.   VIORELE 0.15-0.02/0.01 MG (21/5) tablet Generic drug:  desogestrel-ethinyl estradiol TAKE 1 TABLET DAILY          Objective:    BP 124/86   Pulse 88   Temp 99.3 F (37.4 C) (Oral)   Ht 5' (1.524 m)   Wt 147 lb (66.7 kg)   BMI 28.71 kg/m   No Known Allergies  Wt Readings from Last 3 Encounters:  12/30/17 147 lb (66.7 kg)  08/27/17 141 lb (64 kg)  07/14/17 138 lb 9.6 oz (62.9 kg)    Physical Exam  Constitutional: She is oriented to person, place, and time. She appears well-developed and well-nourished.  HENT:  Head: Normocephalic and atraumatic.  Right Ear: Tympanic membrane, external ear and ear canal normal.  Left Ear: Tympanic membrane, external ear and ear canal normal.  Nose: Nose normal. No rhinorrhea.  Mouth/Throat: Oropharynx is clear and moist and mucous membranes are normal. No  oropharyngeal exudate or posterior oropharyngeal erythema.  Eyes: Pupils are equal, round, and reactive to light. Conjunctivae and EOM are normal.  Neck: Normal range of motion. Neck supple.  Cardiovascular: Normal rate, regular rhythm, normal heart sounds and intact distal pulses.  Pulmonary/Chest: Effort normal and breath sounds normal.  Abdominal: Soft. Bowel sounds are normal.  Neurological: She is alert and oriented to person, place, and time. She has normal reflexes.  Skin: Skin is warm and dry. No rash noted.  Psychiatric: She has a normal mood and affect. Her behavior is normal. Judgment and thought content normal.    Results for orders placed or performed in visit on 01/05/17  Rapid strep  screen (not at [facility])  Result Value Ref Range   Strep Gp A Ag, IA W/Reflex Negative Negative  Culture, Group A Strep  Result Value Ref Range   Strep A Culture Negative   Culture, Group A Strep  Result Value Ref Range   Strep A Culture CANCELED   Veritor Flu A/B Waived  Result Value Ref Range   Influenza A Negative Negative   Influenza B Negative Negative      Assessment & Plan:   1. Anxiety - LORazepam (ATIVAN) 0.5 MG tablet; TAKE 1 TABLET TWICE DAILY AS NEEDED FOR ANXIETY  Dispense: 30 tablet; Refill: 5  2. Attention deficit disorder, unspecified hyperactivity presence - methylphenidate (RITALIN LA) 20 MG 24 hr capsule; Take 1 capsule (20 mg total) by mouth every morning.  Dispense: 30 capsule; Refill: 0 - methylphenidate (RITALIN LA) 20 MG 24 hr capsule; Take 1 capsule (20 mg total) by mouth every morning.  Dispense: 30 capsule; Refill: 0 - methylphenidate (RITALIN LA) 20 MG 24 hr capsule; TAKE (1) CAPSULE DAILY IN THE MORNING.  Dispense: 30 capsule; Refill: 0  3. Dysmenorrhea - norethindrone-ethinyl estradiol (JUNEL FE,GILDESS FE,LOESTRIN FE) 1-20 MG-MCG tablet; Take 1 tablet by mouth daily.  Dispense: 1 Package; Refill: 12   Continue all other maintenance medications as listed above.  Follow up plan: No follow-ups on file.  Educational handout given for [place] PA-C [place] [address]  [place], [place] [address] [phone]   12/30/2017, 11:13 PM

## 2018-03-17 ENCOUNTER — Telehealth: Payer: Self-pay | Admitting: Physician Assistant

## 2018-03-17 ENCOUNTER — Ambulatory Visit (INDEPENDENT_AMBULATORY_CARE_PROVIDER_SITE_OTHER): Payer: Medicare Other

## 2018-03-17 DIAGNOSIS — Z23 Encounter for immunization: Secondary | ICD-10-CM

## 2018-03-17 NOTE — Telephone Encounter (Signed)
Is it okay for pt to get flu shot now, was advised by AJ to wait since Kayla Shepherd had a stroke recently

## 2018-03-17 NOTE — Telephone Encounter (Signed)
Yes, okay to take.

## 2018-03-19 NOTE — Telephone Encounter (Signed)
Patient aware.

## 2018-04-02 ENCOUNTER — Ambulatory Visit: Payer: Medicare Other | Admitting: Physician Assistant

## 2018-04-09 ENCOUNTER — Telehealth: Payer: Self-pay | Admitting: Physician Assistant

## 2018-04-09 DIAGNOSIS — F988 Other specified behavioral and emotional disorders with onset usually occurring in childhood and adolescence: Secondary | ICD-10-CM

## 2018-04-09 MED ORDER — METHYLPHENIDATE HCL ER (LA) 20 MG PO CP24: ORAL_CAPSULE | ORAL | 0 refills | Status: DC

## 2018-04-09 NOTE — Telephone Encounter (Signed)
Prescription sent to pharmacy. Keep follow up.

## 2018-04-09 NOTE — Telephone Encounter (Signed)
Patient has follow up appointment 04/21/18 with Particia Nearing, PA.  Please advise regarding Ritalin refill.

## 2018-04-09 NOTE — Telephone Encounter (Signed)
Patient's mother notified that prescription was sent, and to make sure to keep follow up appointment with Particia Nearing PA 04/21/18.

## 2018-04-09 NOTE — Telephone Encounter (Signed)
Pt had an apt on 11/22 on and had to reschedule due to mother having to take father to apt due to his stroke, pt is rescheduled to see AJ, mom is wanting to know if we can refill her methylphenidate (RITALIN LA) 20 MG 24 hr capsule since she is out and has an upcoming apt.    Pharmacy: [facility]

## 2018-04-21 ENCOUNTER — Encounter: Payer: Self-pay | Admitting: Physician Assistant

## 2018-04-21 ENCOUNTER — Ambulatory Visit (INDEPENDENT_AMBULATORY_CARE_PROVIDER_SITE_OTHER): Payer: Medicare Other | Admitting: Physician Assistant

## 2018-04-21 DIAGNOSIS — F988 Other specified behavioral and emotional disorders with onset usually occurring in childhood and adolescence: Secondary | ICD-10-CM

## 2018-04-21 MED ORDER — METHYLPHENIDATE HCL ER (LA) 20 MG PO CP24: 20.0000 mg | ORAL_CAPSULE | ORAL | 0 refills | Status: DC

## 2018-04-21 MED ORDER — METHYLPHENIDATE HCL ER (LA) 20 MG PO CP24: ORAL_CAPSULE | ORAL | 0 refills | Status: DC

## 2018-04-21 NOTE — Progress Notes (Signed)
BP 135/82   Pulse (!) 101   Temp 99.8 F (37.7 C) (Oral)   Ht 5' (1.524 m)   Wt 153 lb (69.4 kg)   BMI 29.88 kg/m    Subjective:    Patient ID: Kayla Shepherd, female    DOB: 08/16/1980, 37 y.o.   MRN: [id number]  HPI: Kayla Shepherd is a 37 y.o. female presenting on 04/21/2018 for ADD (3 month )  This patient comes in for periodic recheck on medications and conditions including ADHD. She reports that she is having no issues.  All medications are reviewed today. There are no reports of any problems with the medications. All of the medical conditions are reviewed and updated.  Lab work is reviewed and will be ordered as medically necessary. There are no new problems reported with today's visit.   Past Medical History:  Diagnosis Date  . ADD (attention deficit disorder)   . Depression    Relevant past medical, surgical, family and social history reviewed and updated as indicated. Interim medical history since our last visit reviewed. Allergies and medications reviewed and updated. DATA REVIEWED: CHART IN EPIC  Family History reviewed for pertinent findings.  Review of Systems  Constitutional: Negative.  Negative for activity change, fatigue and fever.  HENT: Negative.   Eyes: Negative.   Respiratory: Negative.  Negative for cough.   Cardiovascular: Negative.  Negative for chest pain.  Gastrointestinal: Negative.  Negative for abdominal pain.  Endocrine: Negative.   Genitourinary: Negative.  Negative for dysuria.  Musculoskeletal: Negative.   Skin: Negative.   Neurological: Negative.     Allergies as of 04/21/2018   No Known Allergies     Medication List        Accurate as of 04/21/18 10:18 AM. Always use your most recent med list.          buPROPion 300 MG 24 hr tablet Commonly known as:  WELLBUTRIN XL Take 1 tablet (300 mg total) by mouth daily.   LORazepam 0.5 MG tablet Commonly known as:  ATIVAN TAKE 1 TABLET TWICE DAILY AS NEEDED FOR ANXIETY     methylphenidate 20 MG 24 hr capsule Commonly known as:  RITALIN LA Take 1 capsule (20 mg total) by mouth every morning.   methylphenidate 20 MG 24 hr capsule Commonly known as:  RITALIN LA Take 1 capsule (20 mg total) by mouth every morning.   methylphenidate 20 MG 24 hr capsule Commonly known as:  RITALIN LA TAKE (1) CAPSULE DAILY IN THE MORNING.   norethindrone-ethinyl estradiol 1-20 MG-MCG tablet Commonly known as:  JUNEL FE,GILDESS FE,LOESTRIN FE Take 1 tablet by mouth daily.          Objective:    BP 135/82   Pulse (!) 101   Temp 99.8 F (37.7 C) (Oral)   Ht 5' (1.524 m)   Wt 153 lb (69.4 kg)   BMI 29.88 kg/m   No Known Allergies  Wt Readings from Last 3 Encounters:  04/21/18 153 lb (69.4 kg)  12/30/17 147 lb (66.7 kg)  08/27/17 141 lb (64 kg)    Physical Exam  Constitutional: She is oriented to person, place, and time. She appears well-developed and well-nourished.  HENT:  Head: Normocephalic and atraumatic.  Eyes: Pupils are equal, round, and reactive to light. Conjunctivae and EOM are normal.  Cardiovascular: Normal rate, regular rhythm, normal heart sounds and intact distal pulses.  Pulmonary/Chest: Effort normal and breath sounds normal.  Abdominal: Soft. Bowel sounds are normal.  Neurological: She is alert and oriented to person, place, and time. She has normal reflexes.  Skin: Skin is warm and dry. No rash noted.  Psychiatric: She has a normal mood and affect. Her behavior is normal. Judgment and thought content normal.        Assessment & Plan:   1. Attention deficit disorder, unspecified hyperactivity presence - methylphenidate (RITALIN LA) 20 MG 24 hr capsule; Take 1 capsule (20 mg total) by mouth every morning.  Dispense: 30 capsule; Refill: 0 - methylphenidate (RITALIN LA) 20 MG 24 hr capsule; Take 1 capsule (20 mg total) by mouth every morning.  Dispense: 30 capsule; Refill: 0 - methylphenidate (RITALIN LA) 20 MG 24 hr capsule; TAKE (1)  CAPSULE DAILY IN THE MORNING.  Dispense: 30 capsule; Refill: 0   Continue all other maintenance medications as listed above.  Follow up plan: No follow-ups on file.  Educational handout given for [place] PA-C [place] [address]  [place], [place] [address] [phone]   04/21/2018, 10:18 AM

## 2018-05-28 ENCOUNTER — Encounter: Payer: Self-pay | Admitting: Physician Assistant

## 2018-05-28 ENCOUNTER — Ambulatory Visit (INDEPENDENT_AMBULATORY_CARE_PROVIDER_SITE_OTHER): Payer: Medicare Other | Admitting: Physician Assistant

## 2018-05-28 VITALS — BP 127/82 | HR 94 | Temp 97.2°F | Ht 60.0 in | Wt 151.8 lb

## 2018-05-28 DIAGNOSIS — L570 Actinic keratosis: Secondary | ICD-10-CM | POA: Diagnosis not present

## 2018-05-31 DIAGNOSIS — L821 Other seborrheic keratosis: Secondary | ICD-10-CM | POA: Insufficient documentation

## 2018-05-31 DIAGNOSIS — L570 Actinic keratosis: Secondary | ICD-10-CM | POA: Insufficient documentation

## 2018-05-31 NOTE — Progress Notes (Signed)
BP 127/82   Pulse 94   Temp (!) 97.2 F (36.2 C) (Oral)   Ht 5' (1.524 m)   Wt 151 lb 12.8 oz (68.9 kg)   BMI 29.65 kg/m    Subjective:    Patient ID: Kayla Shepherd, female    DOB: 1980-08-27, 38 y.o.   MRN: [id number]  HPI: Kayla Shepherd is a 38 y.o. female presenting on 05/28/2018 for Skin Discoloration (on right breast) and Nevus (on back )  This patient comes in due to discoloration on her right breast and a mole on her back that she was concerned about.  The area on her breast has become slightly raised and has tan pigment to it.  It is not had any pus or drainage.  It is not tender.  It is not fast growing.  The area on her back is also slightly raised but smaller.  It does not give her any discomfort.  There is one area under her bra strap on the right where there is a slight skin tag type lesion.   Past Medical History:  Diagnosis Date  . ADD (attention deficit disorder)   . Depression    Relevant past medical, surgical, family and social history reviewed and updated as indicated. Interim medical history since our last visit reviewed. Allergies and medications reviewed and updated. DATA REVIEWED: CHART IN EPIC  Family History reviewed for pertinent findings.  Review of Systems  Constitutional: Negative.   HENT: Negative.   Eyes: Negative.   Respiratory: Negative.   Gastrointestinal: Negative.   Genitourinary: Negative.   Skin: Positive for color change.    Allergies as of 05/28/2018   No Known Allergies     Medication List       Accurate as of May 28, 2018 11:59 PM. Always use your most recent med list.        buPROPion 300 MG 24 hr tablet Commonly known as:  WELLBUTRIN XL Take 1 tablet (300 mg total) by mouth daily.   LORazepam 0.5 MG tablet Commonly known as:  ATIVAN TAKE 1 TABLET TWICE DAILY AS NEEDED FOR ANXIETY   methylphenidate 20 MG 24 hr capsule Commonly known as:  RITALIN LA Take 1 capsule (20 mg total) by mouth every morning.   methylphenidate 20 MG 24 hr capsule Commonly known as:  RITALIN LA Take 1 capsule (20 mg total) by mouth every morning.   methylphenidate 20 MG 24 hr capsule Commonly known as:  RITALIN LA TAKE (1) CAPSULE DAILY IN THE MORNING.   norethindrone-ethinyl estradiol 1-20 MG-MCG tablet Commonly known as:  JUNEL FE,GILDESS FE,LOESTRIN FE Take 1 tablet by mouth daily.          Objective:    BP 127/82   Pulse 94   Temp (!) 97.2 F (36.2 C) (Oral)   Ht 5' (1.524 m)   Wt 151 lb 12.8 oz (68.9 kg)   BMI 29.65 kg/m   No Known Allergies  Wt Readings from Last 3 Encounters:  05/28/18 151 lb 12.8 oz (68.9 kg)  04/21/18 153 lb (69.4 kg)  12/30/17 147 lb (66.7 kg)    Physical Exam Constitutional:      Appearance: She is well-developed.  HENT:     Head: Normocephalic and atraumatic.  Eyes:     Conjunctiva/sclera: Conjunctivae normal.     Pupils: Pupils are equal, round, and reactive to light.  Cardiovascular:     Rate and Rhythm: Normal rate and regular rhythm.  Heart sounds: Normal heart sounds.  Pulmonary:     Effort: Pulmonary effort is normal.     Breath sounds: Normal breath sounds.  Abdominal:     General: Bowel sounds are normal.     Palpations: Abdomen is soft.  Skin:    General: Skin is warm and dry.     Findings: Lesion present. No rash.       Neurological:     Mental Status: She is alert and oriented to person, place, and time.     Deep Tendon Reflexes: Reflexes are normal and symmetric.  Psychiatric:        Behavior: Behavior normal.        Thought Content: Thought content normal.        Judgment: Judgment normal.         Assessment & Plan:   1. Keratosis Reassure and monitor   Continue all other maintenance medications as listed above.  Follow up plan: No follow-ups on file.  Educational handout given for [place] PA-C [facility] [address]  [place], [place] [address] [phone]   05/31/2018,  1:58 PM

## 2018-07-22 ENCOUNTER — Telehealth: Payer: Self-pay | Admitting: Physician Assistant

## 2018-07-22 NOTE — Telephone Encounter (Signed)
Due to med being a controlled substance pt will need to keep appt. Mom aware and is fine with keeping appt

## 2018-07-23 ENCOUNTER — Ambulatory Visit (INDEPENDENT_AMBULATORY_CARE_PROVIDER_SITE_OTHER): Payer: Medicare Other | Admitting: Physician Assistant

## 2018-07-23 ENCOUNTER — Other Ambulatory Visit: Payer: Self-pay

## 2018-07-23 DIAGNOSIS — F988 Other specified behavioral and emotional disorders with onset usually occurring in childhood and adolescence: Secondary | ICD-10-CM

## 2018-07-23 DIAGNOSIS — F419 Anxiety disorder, unspecified: Secondary | ICD-10-CM | POA: Diagnosis not present

## 2018-07-23 MED ORDER — METHYLPHENIDATE HCL ER (LA) 20 MG PO CP24: 20.0000 mg | ORAL_CAPSULE | ORAL | 0 refills | Status: DC

## 2018-07-23 MED ORDER — BUPROPION HCL ER (XL) 300 MG PO TB24: 300.0000 mg | ORAL_TABLET | Freq: Every day | ORAL | 3 refills | Status: DC

## 2018-07-23 MED ORDER — METHYLPHENIDATE HCL ER (LA) 20 MG PO CP24: ORAL_CAPSULE | ORAL | 0 refills | Status: DC

## 2018-07-23 MED ORDER — LORAZEPAM 0.5 MG PO TABS: ORAL_TABLET | ORAL | 5 refills | Status: DC

## 2018-07-26 ENCOUNTER — Encounter: Payer: Self-pay | Admitting: Physician Assistant

## 2018-07-26 NOTE — Progress Notes (Signed)
BP 123/86   Pulse (!) 119   Temp 98.6 F (37 C) (Oral)   Ht 5' (1.524 m)   Wt 154 lb 3.2 oz (69.9 kg)   BMI 30.12 kg/m    Subjective:    Patient ID: Kayla Shepherd, female    DOB: 1980/12/06, 38 y.o.   MRN: [id number]  HPI: Kayla Shepherd is a 38 y.o. female presenting on 07/23/2018 for No chief complaint on file.  This patient comes in for a 66-month recheck on her ADHD and anxiety.  Things are getting much better at home, her father is healing well from his stroke.  She is less anxious about that.  She currently takes Ritalin LA 20 mg each morning for ADHD.  She has done this for many years.  Actually had some special needs due to because of what medication her mother took at birth.  She had used Wellbutrin for attention and it helped some but not significantly.  She does feel good on the medication and wants to continue it.  She has alprazolam to take on an as-needed basis.  She may take up to 1 a day at the most.  And tries to concerns them. Current LME is 1 LME.  And in the past year she has had the medication filled 5 times.  This patient returns for a 3 month recheck on ADD/ADHD and medication refills Accompanied [id number] Currently taking Ritalin 20 mg 1 each morning. Behavior-controlled Medication side effects-none Weight loss-no Sleeping habits-good Any concerns-none  PDMP website reviewed: Yes Any suspicious activity; No UDS: July 23, 2018 Contract on file: Yes     Past Medical History:  Diagnosis Date  . ADD (attention deficit disorder)   . Depression    Relevant past medical, surgical, family and social history reviewed and updated as indicated. Interim medical history since our last visit reviewed. Allergies and medications reviewed and updated. DATA REVIEWED: CHART IN EPIC  Family History reviewed for pertinent findings.  Review of Systems  Constitutional: Negative.  Negative for activity change, fatigue and fever.  HENT: Negative.   Eyes: Negative.    Respiratory: Negative.  Negative for cough.   Cardiovascular: Negative.  Negative for chest pain.  Gastrointestinal: Negative.  Negative for abdominal pain.  Endocrine: Negative.   Genitourinary: Negative.  Negative for dysuria.  Musculoskeletal: Negative.   Skin: Negative.   Neurological: Negative.     Allergies as of 07/23/2018   No Known Allergies     Medication List       Accurate as of July 23, 2018 11:59 PM. Always use your most recent med list.        buPROPion 300 MG 24 hr tablet Commonly known as:  WELLBUTRIN XL Take 1 tablet (300 mg total) by mouth daily.   LORazepam 0.5 MG tablet Commonly known as:  ATIVAN TAKE 1 TABLET TWICE DAILY AS NEEDED FOR ANXIETY   methylphenidate 20 MG 24 hr capsule Commonly known as:  RITALIN LA Take 1 capsule (20 mg total) by mouth every morning.   methylphenidate 20 MG 24 hr capsule Commonly known as:  Ritalin LA Take 1 capsule (20 mg total) by mouth every morning.   methylphenidate 20 MG 24 hr capsule Commonly known as:  RITALIN LA TAKE (1) CAPSULE DAILY IN THE MORNING.   norethindrone-ethinyl estradiol 1-20 MG-MCG tablet Commonly known as:  JUNEL FE,GILDESS FE,LOESTRIN FE Take 1 tablet by mouth daily.          Objective:  BP 123/86   Pulse (!) 119   Temp 98.6 F (37 C) (Oral)   Ht 5' (1.524 m)   Wt 154 lb 3.2 oz (69.9 kg)   BMI 30.12 kg/m   No Known Allergies  Wt Readings from Last 3 Encounters:  07/23/18 154 lb 3.2 oz (69.9 kg)  05/28/18 151 lb 12.8 oz (68.9 kg)  04/21/18 153 lb (69.4 kg)    Physical Exam Constitutional:      Appearance: She is well-developed.  HENT:     Head: Normocephalic and atraumatic.  Eyes:     Conjunctiva/sclera: Conjunctivae normal.     Pupils: Pupils are equal, round, and reactive to light.  Cardiovascular:     Rate and Rhythm: Normal rate and regular rhythm.     Heart sounds: Normal heart sounds.  Pulmonary:     Effort: Pulmonary effort is normal.     Breath sounds:  Normal breath sounds.  Abdominal:     General: Bowel sounds are normal.     Palpations: Abdomen is soft.  Skin:    General: Skin is warm and dry.     Findings: No rash.  Neurological:     Mental Status: She is alert and oriented to person, place, and time.     Deep Tendon Reflexes: Reflexes are normal and symmetric.  Psychiatric:        Behavior: Behavior normal.        Thought Content: Thought content normal.        Judgment: Judgment normal.     Results for orders placed or performed in visit on 01/05/17  Rapid strep screen (not at [facility])  Result Value Ref Range   Strep Gp A Ag, IA W/Reflex Negative Negative  Culture, Group A Strep  Result Value Ref Range   Strep A Culture Negative   Culture, Group A Strep  Result Value Ref Range   Strep A Culture CANCELED   Veritor Flu A/B Waived  Result Value Ref Range   Influenza A Negative Negative   Influenza B Negative Negative      Assessment & Plan:   1. Attention deficit disorder, unspecified hyperactivity presence - ToxASSURE Select 13 (MW), Urine - methylphenidate (RITALIN LA) 20 MG 24 hr capsule; Take 1 capsule (20 mg total) by mouth every morning.  Dispense: 30 capsule; Refill: 0 - methylphenidate (RITALIN LA) 20 MG 24 hr capsule; Take 1 capsule (20 mg total) by mouth every morning.  Dispense: 30 capsule; Refill: 0 - methylphenidate (RITALIN LA) 20 MG 24 hr capsule; TAKE (1) CAPSULE DAILY IN THE MORNING.  Dispense: 30 capsule; Refill: 0  2. Anxiety - LORazepam (ATIVAN) 0.5 MG tablet; TAKE 1 TABLET TWICE DAILY AS NEEDED FOR ANXIETY  Dispense: 30 tablet; Refill: 5 - buPROPion (WELLBUTRIN XL) 300 MG 24 hr tablet; Take 1 tablet (300 mg total) by mouth daily.  Dispense: 90 tablet; Refill: 3   Continue all other maintenance medications as listed above.  Follow up plan: Return in about 3 months (around 10/23/2018).  Educational handout given for [place] PA-C [facility] [address]  [place], [place] [address] [phone]   07/26/2018, 12:17 AM

## 2018-07-29 LAB — TOXASSURE SELECT 13 (MW), URINE

## 2018-10-14 ENCOUNTER — Telehealth: Payer: Self-pay | Admitting: Physician Assistant

## 2018-10-14 NOTE — Telephone Encounter (Signed)
Pharmacy [facility]   Pt took her last pill of methylphenidate (RITALIN LA) 20 MG 24 hr capsule this morning, according to pts mother, pt is scheduled 6/15 with [facility] pt mother is wanting to know if [facility] would give her enough until her apt. Advised pts mother that Kayla Shepherd is out of the office until tomorrow so it could be tomorrow before she hears anything.

## 2018-10-14 NOTE — Telephone Encounter (Signed)
I am sorry, but this will have to wait for her appointment.Marland Kitchen

## 2018-10-25 ENCOUNTER — Encounter: Payer: Self-pay | Admitting: Physician Assistant

## 2018-10-25 ENCOUNTER — Other Ambulatory Visit: Payer: Self-pay

## 2018-10-25 ENCOUNTER — Ambulatory Visit (INDEPENDENT_AMBULATORY_CARE_PROVIDER_SITE_OTHER): Payer: Medicare Other | Admitting: Physician Assistant

## 2018-10-25 DIAGNOSIS — F988 Other specified behavioral and emotional disorders with onset usually occurring in childhood and adolescence: Secondary | ICD-10-CM

## 2018-10-25 DIAGNOSIS — N946 Dysmenorrhea, unspecified: Secondary | ICD-10-CM | POA: Diagnosis not present

## 2018-10-25 MED ORDER — METHYLPHENIDATE HCL ER (LA) 20 MG PO CP24: ORAL_CAPSULE | ORAL | 0 refills | Status: DC

## 2018-10-25 MED ORDER — METHYLPHENIDATE HCL ER (LA) 20 MG PO CP24: 20.0000 mg | ORAL_CAPSULE | ORAL | 0 refills | Status: DC

## 2018-10-25 MED ORDER — NORETHIN ACE-ETH ESTRAD-FE 1-20 MG-MCG PO TABS: 1.0000 | ORAL_TABLET | Freq: Every day | ORAL | 12 refills | Status: DC

## 2018-10-25 NOTE — Progress Notes (Signed)
Telephone visit  Subjective: Kayla Shepherd on ADHD medication PCP: Kayla Sleeper, PA-C Kayla Shepherd:[id number] Kayla Shepherd is a 38 y.o. female calls for telephone consult today. Patient provides verbal consent for consult held via phone.  Patient is identified with 2 separate identifiers.  At this time the entire area is on COVID-19 social distancing and stay home orders are in place.  Patient is of higher risk and therefore we are performing this by a virtual method.  Location of patient: home Location of provider: HOME Others present for call: no  This is a recheck on her ADHD.  She has been doing well and having no issues with her medication.  She states that she is having no side effects.  She is having good attention and focus.  She has been home most of the time and these covered restrictions.  She also needs to have her birth control refilled.  She reports that she is doing well without medication.  Her cycles are well controlled and having no issues.   ROS: Per HPI  No Known Allergies Past Medical History:  Diagnosis Date  . ADD (attention deficit disorder)   . Depression     Current Outpatient Medications:  .  buPROPion (WELLBUTRIN XL) 300 MG 24 hr tablet, Take 1 tablet (300 mg total) by mouth daily., Disp: 90 tablet, Rfl: 3 .  LORazepam (ATIVAN) 0.5 MG tablet, TAKE 1 TABLET TWICE DAILY AS NEEDED FOR ANXIETY, Disp: 30 tablet, Rfl: 5 .  methylphenidate (RITALIN LA) 20 MG 24 hr capsule, Take 1 capsule (20 mg total) by mouth every morning., Disp: 30 capsule, Rfl: 0 .  methylphenidate (RITALIN LA) 20 MG 24 hr capsule, Take 1 capsule (20 mg total) by mouth every morning., Disp: 30 capsule, Rfl: 0 .  methylphenidate (RITALIN LA) 20 MG 24 hr capsule, TAKE (1) CAPSULE DAILY IN THE MORNING., Disp: 30 capsule, Rfl: 0 .  norethindrone-ethinyl estradiol (LOESTRIN FE) 1-20 MG-MCG tablet, Take 1 tablet by mouth daily., Disp: 1 Package, Rfl: 12  Assessment/ Plan: 38 y.o. female   1. Attention  deficit disorder, unspecified hyperactivity presence - methylphenidate (RITALIN LA) 20 MG 24 hr capsule; Take 1 capsule (20 mg total) by mouth every morning.  Dispense: 30 capsule; Refill: 0 - methylphenidate (RITALIN LA) 20 MG 24 hr capsule; Take 1 capsule (20 mg total) by mouth every morning.  Dispense: 30 capsule; Refill: 0 - methylphenidate (RITALIN LA) 20 MG 24 hr capsule; TAKE (1) CAPSULE DAILY IN THE MORNING.  Dispense: 30 capsule; Refill: 0  2. Dysmenorrhea - norethindrone-ethinyl estradiol (LOESTRIN FE) 1-20 MG-MCG tablet; Take 1 tablet by mouth daily.  Dispense: 1 Package; Refill: 12   Continue all other maintenance medications as listed above.  Start time: 1:52 PM End time: 1:58 PM  Meds ordered this encounter  Medications  . methylphenidate (RITALIN LA) 20 MG 24 hr capsule    Sig: Take 1 capsule (20 mg total) by mouth every morning.    Dispense:  30 capsule    Refill:  0    Fill 30 days from original script date    Order Specific Question:   Supervising Provider    Answer:   Kayla Shepherd [[id number]]  . methylphenidate (RITALIN LA) 20 MG 24 hr capsule    Sig: Take 1 capsule (20 mg total) by mouth every morning.    Dispense:  30 capsule    Refill:  0    Fill 60 days from original script date  Order Specific Question:   Supervising Provider    Answer:   Kayla Shepherd [[id number]]  . methylphenidate (RITALIN LA) 20 MG 24 hr capsule    Sig: TAKE (1) CAPSULE DAILY IN THE MORNING.    Dispense:  30 capsule    Refill:  0    Order Specific Question:   Supervising Provider    Answer:   Kayla Shepherd [[id number]]  . norethindrone-ethinyl estradiol (LOESTRIN FE) 1-20 MG-MCG tablet    Sig: Take 1 tablet by mouth daily.    Dispense:  1 Package    Refill:  12    Order Specific Question:   Supervising Provider    Answer:   Kayla Shepherd [[id number]]    Kayla Nearing PA-C Kayla Shepherd [phone]

## 2018-12-27 DIAGNOSIS — H5213 Myopia, bilateral: Secondary | ICD-10-CM | POA: Diagnosis not present

## 2019-02-09 ENCOUNTER — Encounter: Payer: Self-pay | Admitting: Physician Assistant

## 2019-02-09 ENCOUNTER — Ambulatory Visit (INDEPENDENT_AMBULATORY_CARE_PROVIDER_SITE_OTHER): Payer: Medicare Other | Admitting: Physician Assistant

## 2019-02-09 DIAGNOSIS — F419 Anxiety disorder, unspecified: Secondary | ICD-10-CM

## 2019-02-09 DIAGNOSIS — F988 Other specified behavioral and emotional disorders with onset usually occurring in childhood and adolescence: Secondary | ICD-10-CM

## 2019-02-09 MED ORDER — METHYLPHENIDATE HCL ER (LA) 20 MG PO CP24: 20.0000 mg | ORAL_CAPSULE | ORAL | 0 refills | Status: DC

## 2019-02-09 MED ORDER — LORAZEPAM 0.5 MG PO TABS: ORAL_TABLET | ORAL | 5 refills | Status: DC

## 2019-02-09 MED ORDER — METHYLPHENIDATE HCL ER (LA) 20 MG PO CP24: ORAL_CAPSULE | ORAL | 0 refills | Status: DC

## 2019-02-09 NOTE — Progress Notes (Signed)
Telephone visit  Subjective: CC anxiety and ADHD PCP: Terald Sleeper, PA-C [id number] Kayla Shepherd is a 38 y.o. female calls for telephone consult today. Patient provides verbal consent for consult held via phone.  Patient is identified with 2 separate identifiers.  At this time the entire area is on COVID-19 social distancing and stay home orders are in place.  Patient is of higher risk and therefore we are performing this by a virtual method.  Location of patient: Home Location of provider: HOME Others present for call: Mother   This patient returns for a 3 month recheck on ADD/ADHD and medication refills Accompanied [id number] Currently taking Ritalin 20 mg 1 each morning. Behavior-controlled Medication side effects-none Weight loss-no Sleeping habits-good Any concerns-none  PDMP website reviewed: Yes Any suspicious activity; No UDS: July 23, 2018 Contract on file:  04/23/2018  We will plan to recheck her in 3 months   ROS: Per HPI  No Known Allergies Past Medical History:  Diagnosis Date  . ADD (attention deficit disorder)   . Depression     Current Outpatient Medications:  .  buPROPion (WELLBUTRIN XL) 300 MG 24 hr tablet, Take 1 tablet (300 mg total) by mouth daily., Disp: 90 tablet, Rfl: 3 .  LORazepam (ATIVAN) 0.5 MG tablet, TAKE 1 TABLET TWICE DAILY AS NEEDED FOR ANXIETY, Disp: 30 tablet, Rfl: 5 .  methylphenidate (RITALIN LA) 20 MG 24 hr capsule, Take 1 capsule (20 mg total) by mouth every morning., Disp: 30 capsule, Rfl: 0 .  methylphenidate (RITALIN LA) 20 MG 24 hr capsule, Take 1 capsule (20 mg total) by mouth every morning., Disp: 30 capsule, Rfl: 0 .  methylphenidate (RITALIN LA) 20 MG 24 hr capsule, TAKE (1) CAPSULE DAILY IN THE MORNING., Disp: 30 capsule, Rfl: 0 .  norethindrone-ethinyl estradiol (LOESTRIN FE) 1-20 MG-MCG tablet, Take 1 tablet by mouth daily., Disp: 1 Package, Rfl: 12  Assessment/ Plan: 38 y.o. female   1. Anxiety - LORazepam  (ATIVAN) 0.5 MG tablet; TAKE 1 TABLET TWICE DAILY AS NEEDED FOR ANXIETY  Dispense: 30 tablet; Refill: 5  2. Attention deficit disorder, unspecified hyperactivity presence - methylphenidate (RITALIN LA) 20 MG 24 hr capsule; Take 1 capsule (20 mg total) by mouth every morning.  Dispense: 30 capsule; Refill: 0 - methylphenidate (RITALIN LA) 20 MG 24 hr capsule; Take 1 capsule (20 mg total) by mouth every morning.  Dispense: 30 capsule; Refill: 0 - methylphenidate (RITALIN LA) 20 MG 24 hr capsule; TAKE (1) CAPSULE DAILY IN THE MORNING.  Dispense: 30 capsule; Refill: 0   No follow-ups on file.  Continue all other maintenance medications as listed above.  Start time: 9:15 AM End time: 9:26 AM  Meds ordered this encounter  Medications  . LORazepam (ATIVAN) 0.5 MG tablet    Sig: TAKE 1 TABLET TWICE DAILY AS NEEDED FOR ANXIETY    Dispense:  30 tablet    Refill:  5    Order Specific Question:   Supervising Provider    Answer:   Janora Norlander [id number]  . methylphenidate (RITALIN LA) 20 MG 24 hr capsule    Sig: Take 1 capsule (20 mg total) by mouth every morning.    Dispense:  30 capsule    Refill:  0    Fill 30 days from original script date    Order Specific Question:   Supervising Provider    Answer:   Janora Norlander [id number]  . methylphenidate (RITALIN LA) 20 MG 24 hr capsule  Sig: Take 1 capsule (20 mg total) by mouth every morning.    Dispense:  30 capsule    Refill:  0    Fill 60 days from original script date    Order Specific Question:   Supervising Provider    Answer:   Janora Norlander [id number]  . methylphenidate (RITALIN LA) 20 MG 24 hr capsule    Sig: TAKE (1) CAPSULE DAILY IN THE MORNING.    Dispense:  30 capsule    Refill:  0    Order Specific Question:   Supervising Provider    Answer:   Janora Norlander [id number]    Particia Nearing PA-C [place] [phone]

## 2019-02-11 ENCOUNTER — Telehealth: Payer: Self-pay | Admitting: *Deleted

## 2019-02-11 NOTE — Telephone Encounter (Addendum)
Prior Auth for Lorazepam 0.5mg -APPROVED Effective from 02/11/2019 through 02/11/2020  Pharmacy notified  Key: [id number]  Your information has been submitted to [place]. [employer] [place] will review the request and notify you of the determination decision directly, typically within 3 business days of your submission and once all necessary information is received.  You will also receive your request decision electronically. To check for an update later, open the request again from your dashboard.  If [employer] [place] has not responded within the specified timeframe or if you have any questions about your PA submission, contact [place] directly at [facility]) [phone] or ([place] Ramirez) [phone].

## 2019-03-25 ENCOUNTER — Ambulatory Visit: Payer: Medicare Other

## 2019-03-31 ENCOUNTER — Ambulatory Visit: Payer: Medicare Other

## 2019-04-04 ENCOUNTER — Other Ambulatory Visit: Payer: Self-pay | Admitting: Physician Assistant

## 2019-04-04 MED ORDER — NORGESTIM-ETH ESTRAD TRIPHASIC 0.18/0.215/0.25 MG-25 MCG PO TABS: 1.0000 | ORAL_TABLET | Freq: Every day | ORAL | 12 refills | Status: DC

## 2019-06-14 ENCOUNTER — Other Ambulatory Visit: Payer: Self-pay | Admitting: Physician Assistant

## 2019-06-14 DIAGNOSIS — F988 Other specified behavioral and emotional disorders with onset usually occurring in childhood and adolescence: Secondary | ICD-10-CM

## 2019-06-21 ENCOUNTER — Telehealth: Payer: Self-pay | Admitting: Physician Assistant

## 2019-06-21 NOTE — Telephone Encounter (Signed)
I spoke with the pt's mother and she states they received a letter in the mail regarding needing PA for this. Advised them to bring the letter by or mail it to me so I can look at it and get any case ID numbers etc off of it.

## 2019-06-21 NOTE — Telephone Encounter (Signed)
What is the name of the medication? methythenidate  Have you contacted your pharmacy to request a refill? Need prior approval  Which pharmacy would you like this sent to? [facility]   Patient notified that their request is being sent to the clinical staff for review and that they should receive a call once it is complete. If they do not receive a call within 24 hours they can check with their pharmacy or our office.    Per blue medicare

## 2019-06-24 ENCOUNTER — Telehealth: Payer: Self-pay | Admitting: *Deleted

## 2019-06-24 NOTE — Telephone Encounter (Signed)
Prior Auth for methylphenid ER cap 20mg -In Process  (Key: [id number])    Your information has been submitted to [place]. [employer] [place] will review the request and notify you of the determination decision directly, typically within 3 business days of your submission and once all necessary information is received.  You will also receive your request decision electronically. To check for an update later, open the request again from your dashboard.  If [employer] [place] has not responded within the specified timeframe or if you have any questions about your PA submission, contact [place] directly at [facility]) [phone] or ([place]) [phone].

## 2019-06-27 NOTE — Telephone Encounter (Signed)
Prior Auth for methylphenid ER cap 20mg - APPROVED   Effective from 06/24/2019 through 06/23/2020.  ([employer] claims handler)  Pharmacy Aware

## 2019-07-07 DIAGNOSIS — Z0120 Encounter for dental examination and cleaning without abnormal findings: Secondary | ICD-10-CM | POA: Diagnosis not present

## 2019-07-12 ENCOUNTER — Other Ambulatory Visit: Payer: Self-pay | Admitting: Physician Assistant

## 2019-07-21 ENCOUNTER — Other Ambulatory Visit: Payer: Self-pay | Admitting: Physician Assistant

## 2019-07-21 ENCOUNTER — Telehealth: Payer: Self-pay | Admitting: Physician Assistant

## 2019-07-21 DIAGNOSIS — F988 Other specified behavioral and emotional disorders with onset usually occurring in childhood and adolescence: Secondary | ICD-10-CM

## 2019-07-21 MED ORDER — METHYLPHENIDATE HCL ER (LA) 20 MG PO CP24: ORAL_CAPSULE | ORAL | 0 refills | Status: DC

## 2019-07-21 NOTE — Telephone Encounter (Signed)
  Medication Request  07/21/2019  What is the name of the medication? methylphenidate (RITALIN LA) 20 MG 24 hr capsule  Have you contacted your pharmacy to request a refill? Yes [facility] but [facility] next available apt is 08/17/2019 televisit and pt will be out before then. Can a refill be sent in?  Which pharmacy would you like this sent to? [facility]   Patient notified that their request is being sent to the clinical staff for review and that they should receive a call once it is complete. If they do not receive a call within 24 hours they can check with their pharmacy or our office.

## 2019-07-21 NOTE — Telephone Encounter (Signed)
  REFERRAL REQUEST Telephone Note 07/21/2019  What type of referral do you need? Dermatolgy   Have you been seen at our office for this problem? Patient would like a yearly exam done - Her father had to have some spots removed. (Advise that they may need an appointment with their PCP before a referral can be done)  Is there a particular doctor or location that you prefer? [facility]  Patient notified that referrals can take up to a week or longer to process. If they haven't heard anything within a week they should call back and speak with the referral department.

## 2019-07-22 NOTE — Telephone Encounter (Signed)
Aware - Already had a visit scheduled.

## 2019-07-22 NOTE — Telephone Encounter (Signed)
Yes lets plan a visit, but it can also be a video visit

## 2019-08-16 ENCOUNTER — Encounter: Payer: Self-pay | Admitting: Family Medicine

## 2019-08-16 ENCOUNTER — Ambulatory Visit (INDEPENDENT_AMBULATORY_CARE_PROVIDER_SITE_OTHER): Payer: Medicare Other | Admitting: Family Medicine

## 2019-08-16 DIAGNOSIS — F988 Other specified behavioral and emotional disorders with onset usually occurring in childhood and adolescence: Secondary | ICD-10-CM

## 2019-08-16 DIAGNOSIS — F419 Anxiety disorder, unspecified: Secondary | ICD-10-CM | POA: Diagnosis not present

## 2019-08-16 MED ORDER — BUPROPION HCL ER (XL) 300 MG PO TB24: 300.0000 mg | ORAL_TABLET | Freq: Every day | ORAL | 0 refills | Status: DC

## 2019-08-16 MED ORDER — METHYLPHENIDATE HCL ER (LA) 20 MG PO CP24: 20.0000 mg | ORAL_CAPSULE | ORAL | 0 refills | Status: DC

## 2019-08-16 NOTE — Progress Notes (Signed)
Virtual Visit via Telephone Note  I connected with Kayla Shepherd on 08/16/19 at 9:37 AM by telephone and verified that I am speaking with the correct person using two identifiers. Kayla Shepherd is currently located at home and her mother Hassan Rowan) is currently with her during this visit. The provider, Loman Brooklyn, FNP is located in their home at time of visit.  I discussed the limitations, risks, security and privacy concerns of performing an evaluation and management service by telephone and the availability of in person appointments. I also discussed with the patient that there may be a patient responsible charge related to this service. The patient expressed understanding and agreed to proceed.  Subjective: PCP: Loman Brooklyn, FNP  Chief Complaint  Patient presents with  . Medication Refill   Patient is in need of a refill of Ritalin which she takes for ADD. Patient has a history of brain damage and has been on something for ADD since she started school.   Patient also has a prescription for lorazepam which she takes when she gets really nervous and anxious. At this time she paces and wrings her hands. She does not require this very often. Her last refill of 15 tablets was on 06/14/2019 and she still has some.    ROS: Per HPI  Current Outpatient Medications:  .  buPROPion (WELLBUTRIN XL) 300 MG 24 hr tablet, Take 1 tablet (300 mg total) by mouth daily., Disp: 90 tablet, Rfl: 0 .  LORazepam (ATIVAN) 0.5 MG tablet, TAKE 1 TABLET TWICE DAILY AS NEEDED FOR ANXIETY, Disp: 30 tablet, Rfl: 5 .  methylphenidate (RITALIN LA) 20 MG 24 hr capsule, TAKE (1) CAPSULE DAILY IN THE MORNING., Disp: 30 capsule, Rfl: 0 .  methylphenidate (RITALIN LA) 20 MG 24 hr capsule, TAKE (1) CAPSULE DAILY IN THE MORNING., Disp: 30 capsule, Rfl: 0 .  methylphenidate (RITALIN LA) 20 MG 24 hr capsule, Take 1 capsule (20 mg total) by mouth every morning., Disp: 30 capsule, Rfl: 0 .  Norgestimate-Ethinyl  Estradiol Triphasic 0.18/0.215/0.25 MG-25 MCG tab, Take 1 tablet by mouth daily., Disp: 1 Package, Rfl: 12  No Known Allergies Past Medical History:  Diagnosis Date  . ADD (attention deficit disorder)   . Depression     Observations/Objective: A&O  No respiratory distress or wheezing audible over the phone Mood, judgement, and thought processes all WNL   Assessment and Plan: 1. Attention deficit disorder, unspecified hyperactivity presence - I am okay with filling the prescription of Ritalin long term as it appears patient takes it appropriately. She is in need of an updated controlled substance agreement and urine drug screen which will be completed in the office at her next visit sometime within the next 30 days. We did discuss the alternative of Strattera if they are interested since it is not a controlled substance and would not require a yearly prior authorization.  - methylphenidate (RITALIN LA) 20 MG 24 hr capsule; Take 1 capsule (20 mg total) by mouth every morning.  Dispense: 30 capsule; Refill: 0  2. Anxiety - I am okay with filling the prescription of lorazepam long term as it appears patient takes it appropriately. She is in need of an updated controlled substance agreement and urine drug screen which will be completed in the office at her next visit sometime within the next 30 days. She is in need of a refill of her Wellbutrin today as well. She has lorazepam at home.  - buPROPion (WELLBUTRIN XL) 300  MG 24 hr tablet; Take 1 tablet (300 mg total) by mouth daily.  Dispense: 90 tablet; Refill: 0   Follow Up Instructions: Return within the next 30 days, for routine f/u with labs, CSA & UDS.  I discussed the assessment and treatment plan with the patient. The patient was provided an opportunity to ask questions and all were answered. The patient agreed with the plan and demonstrated an understanding of the instructions.   The patient was advised to call back or seek an in-person  evaluation if the symptoms worsen or if the condition fails to improve as anticipated.  The above assessment and management plan was discussed with the patient. The patient verbalized understanding of and has agreed to the management plan. Patient is aware to call the clinic if symptoms persist or worsen. Patient is aware when to return to the clinic for a follow-up visit. Patient educated on when it is appropriate to go to the emergency department.   Time call ended: 9:54 AM  I provided 19 minutes of non-face-to-face time during this encounter.  Hendricks Limes, MSN, APRN, FNP-C [facility] 08/16/19

## 2019-08-17 ENCOUNTER — Ambulatory Visit: Payer: Medicare Other | Admitting: Physician Assistant

## 2019-08-17 ENCOUNTER — Ambulatory Visit: Payer: Medicare Other | Admitting: Family Medicine

## 2019-09-15 ENCOUNTER — Ambulatory Visit: Payer: Medicare Other | Admitting: Family Medicine

## 2019-09-19 NOTE — Progress Notes (Signed)
Assessment & Plan:  1. Attention deficit disorder, unspecified hyperactivity presence - Switching from Ritalin to Strattera so that patient has one less controlled substance and she does not have to go through the prior authorization process every year. If she is not happy with it, I will switch it back.  - CMP14+EGFR - atomoxetine (STRATTERA) 40 MG capsule; Take 1 capsule (40 mg total) by mouth daily.  Dispense: 30 capsule; Refill: 2  2. Anxiety - Patient is interested in daily medication to treat anxiety. We are going to do this at her next visit since she is starting the Strattera today. Continue lorazepam PRN.  - Compliance Drug Analysis, Ur - LORazepam (ATIVAN) 0.5 MG tablet; TAKE 1 TABLET TWICE DAILY AS NEEDED FOR ANXIETY  Dispense: 15 tablet; Refill: 2  3. Mixed hyperlipidemia - Lipid panel  4. Screening for deficiency anemia - CBC with Differential/Platelet  5. Controlled substance agreement signed - Signed for Ativan. PDMP reviewed with no red flags.    Return 3-4 weeks, for ADHD, anxiety.  Hendricks Limes, MSN, APRN, FNP-C [facility]  Subjective:    Patient ID: Kayla Shepherd, female    DOB: Mar 06, 1981, 39 y.o.   MRN: [id number]  Patient Care Team: Loman Brooklyn, FNP as PCP - General (Family Medicine)   Chief Complaint:  Chief Complaint  Patient presents with  . ADHD    check up of chronic medical conditions  . Anxiety    HPI: Kayla Shepherd is a 39 y.o. female presenting on 09/20/2019 for ADHD (check up of chronic medical conditions) and Anxiety  Patient is accompanied by her mother, who she is okay with being present.   Patient is here for a follow-up of anxiety and ADD.   She takes lorazepam when she gets really nervous and anxious. Mom reports when this is occurring she does a lot of hand wringing. She does not take the lorazepam very often. Her last refill of 15 tablets was filled on 06/14/19 and she still has a couple.    Patient has been taking Ritalin for ADD but is interested in trying Strattera after our last discussion about it.   New complaints: None  Social history:  Relevant past medical, surgical, family and social history reviewed and updated as indicated. Interim medical history since our last visit reviewed.  Allergies and medications reviewed and updated.  DATA REVIEWED: CHART IN EPIC  ROS: Negative unless specifically indicated above in HPI.    Current Outpatient Medications:  .  buPROPion (WELLBUTRIN XL) 300 MG 24 hr tablet, Take 1 tablet (300 mg total) by mouth daily., Disp: 90 tablet, Rfl: 0 .  LORazepam (ATIVAN) 0.5 MG tablet, TAKE 1 TABLET TWICE DAILY AS NEEDED FOR ANXIETY, Disp: 15 tablet, Rfl: 2 .  Norgestimate-Ethinyl Estradiol Triphasic 0.18/0.215/0.25 MG-25 MCG tab, Take 1 tablet by mouth daily., Disp: 1 Package, Rfl: 12 .  atomoxetine (STRATTERA) 40 MG capsule, Take 1 capsule (40 mg total) by mouth daily., Disp: 30 capsule, Rfl: 2   No Known Allergies Past Medical History:  Diagnosis Date  . ADD (attention deficit disorder)   . Depression     History reviewed. No pertinent surgical history.  Social History   Socioeconomic History  . Marital status: Single    Spouse name: Not on file  . Number of children: Not on file  . Years of education: Not on file  . Highest education level: Not on file  Occupational History  . Not on file  Tobacco Use  . Smoking status: Never Smoker  . Smokeless tobacco: Never Used  Substance and Sexual Activity  . Alcohol use: No  . Drug use: No  . Sexual activity: Not on file  Other Topics Concern  . Not on file  Social History Narrative  . Not on file   Social Determinants of Health   Financial Resource Strain:   . Difficulty of Paying Living Expenses:   Food Insecurity:   . Worried About Charity fundraiser in the Last Year:   . Arboriculturist in the Last Year:   Transportation Needs:   . Film/video editor (Medical):    Marland Kitchen Lack of Transportation (Non-Medical):   Physical Activity:   . Days of Exercise per Week:   . Minutes of Exercise per Session:   Stress:   . Feeling of Stress :   Social Connections:   . Frequency of Communication with Friends and Family:   . Frequency of Social Gatherings with Friends and Family:   . Attends Religious Services:   . Active Member of Clubs or Organizations:   . Attends Archivist Meetings:   Marland Kitchen Marital Status:   Intimate Partner Violence:   . Fear of Current or Ex-Partner:   . Emotionally Abused:   Marland Kitchen Physically Abused:   . Sexually Abused:         Objective:    BP 136/86   Pulse 97   Temp 98.3 F (36.8 C) (Temporal)   Ht '5\' 1"'$  (1.549 m)   Wt 142 lb 3.2 oz (64.5 kg)   LMP  (Approximate)   SpO2 99%   BMI 26.87 kg/m   Wt Readings from Last 3 Encounters:  09/20/19 142 lb 3.2 oz (64.5 kg)  07/23/18 154 lb 3.2 oz (69.9 kg)  05/28/18 151 lb 12.8 oz (68.9 kg)    Physical Exam Vitals reviewed.  Constitutional:      General: She is not in acute distress.    Appearance: Normal appearance. She is overweight. She is not ill-appearing, toxic-appearing or diaphoretic.  HENT:     Head: Normocephalic and atraumatic.  Eyes:     General: No scleral icterus.       Right eye: No discharge.        Left eye: No discharge.     Conjunctiva/sclera: Conjunctivae normal.  Cardiovascular:     Rate and Rhythm: Normal rate and regular rhythm.     Heart sounds: Normal heart sounds. No murmur. No friction rub. No gallop.   Pulmonary:     Effort: Pulmonary effort is normal. No respiratory distress.     Breath sounds: Normal breath sounds. No stridor. No wheezing, rhonchi or rales.  Musculoskeletal:        General: Normal range of motion.     Cervical back: Normal range of motion.  Skin:    General: Skin is warm and dry.     Capillary Refill: Capillary refill takes less than 2 seconds.  Neurological:     General: No focal deficit present.     Mental Status:  She is alert and oriented to person, place, and time. Mental status is at baseline.  Psychiatric:        Mood and Affect: Mood normal.        Behavior: Behavior normal.        Thought Content: Thought content normal.        Judgment: Judgment normal.     No results found for:  TSH Lab Results  Component Value Date   WBC 8.1 09/20/2019   HGB 14.4 09/20/2019   HCT 43.2 09/20/2019   MCV 96 09/20/2019   PLT 347 09/20/2019   Lab Results  Component Value Date   NA 145 (H) 09/20/2019   K 4.4 09/20/2019   CO2 22 09/20/2019   GLUCOSE 90 09/20/2019   BUN 8 09/20/2019   CREATININE 0.86 09/20/2019   BILITOT <0.2 09/20/2019   ALKPHOS 75 09/20/2019   AST 19 09/20/2019   ALT 13 09/20/2019   PROT 7.7 09/20/2019   ALBUMIN 4.8 09/20/2019   CALCIUM 9.8 09/20/2019   Lab Results  Component Value Date   CHOL 219 (H) 09/20/2019   Lab Results  Component Value Date   HDL 45 09/20/2019   Lab Results  Component Value Date   LDLCALC 150 (H) 09/20/2019   Lab Results  Component Value Date   TRIG 131 09/20/2019   Lab Results  Component Value Date   CHOLHDL 4.9 (H) 09/20/2019   No results found for: HGBA1C

## 2019-09-20 ENCOUNTER — Ambulatory Visit (INDEPENDENT_AMBULATORY_CARE_PROVIDER_SITE_OTHER): Payer: Medicare Other | Admitting: Family Medicine

## 2019-09-20 ENCOUNTER — Encounter: Payer: Self-pay | Admitting: Family Medicine

## 2019-09-20 ENCOUNTER — Other Ambulatory Visit: Payer: Self-pay

## 2019-09-20 VITALS — BP 136/86 | HR 97 | Temp 98.3°F | Ht 61.0 in | Wt 142.2 lb

## 2019-09-20 DIAGNOSIS — Z130 Encounter for screening for diseases of the blood and blood-forming organs and certain disorders involving the immune mechanism: Secondary | ICD-10-CM

## 2019-09-20 DIAGNOSIS — F988 Other specified behavioral and emotional disorders with onset usually occurring in childhood and adolescence: Secondary | ICD-10-CM | POA: Diagnosis not present

## 2019-09-20 DIAGNOSIS — E782 Mixed hyperlipidemia: Secondary | ICD-10-CM

## 2019-09-20 DIAGNOSIS — Z79899 Other long term (current) drug therapy: Secondary | ICD-10-CM

## 2019-09-20 DIAGNOSIS — F419 Anxiety disorder, unspecified: Secondary | ICD-10-CM

## 2019-09-20 MED ORDER — ATOMOXETINE HCL 40 MG PO CAPS: 40.0000 mg | ORAL_CAPSULE | Freq: Every day | ORAL | 2 refills | Status: DC

## 2019-09-20 MED ORDER — LORAZEPAM 0.5 MG PO TABS: ORAL_TABLET | ORAL | 2 refills | Status: DC

## 2019-09-21 LAB — CMP14+EGFR
ALT: 13 IU/L (ref 0–32)
AST: 19 IU/L (ref 0–40)
Albumin/Globulin Ratio: 1.7 (ref 1.2–2.2)
Albumin: 4.8 g/dL (ref 3.8–4.8)
Alkaline Phosphatase: 75 IU/L (ref 39–117)
BUN/Creatinine Ratio: 9 (ref 9–23)
BUN: 8 mg/dL (ref 6–20)
Bilirubin Total: <0.2 mg/dL (ref 0.0–1.2)
CO2: 22 mmol/L (ref 20–29)
Calcium: 9.8 mg/dL (ref 8.7–10.2)
Chloride: 105 mmol/L (ref 96–106)
Creatinine, Ser: 0.86 mg/dL (ref 0.57–1.00)
GFR calc Af Amer: 100 mL/min/{1.73_m2} (ref >59)
GFR calc non Af Amer: 87 mL/min/{1.73_m2} (ref >59)
Globulin, Total: 2.9 g/dL (ref 1.5–4.5)
Glucose: 90 mg/dL (ref 65–99)
Potassium: 4.4 mmol/L (ref 3.5–5.2)
Sodium: 145 mmol/L — ABNORMAL HIGH (ref 134–144)
Total Protein: 7.7 g/dL (ref 6.0–8.5)

## 2019-09-21 LAB — CBC WITH DIFFERENTIAL/PLATELET
Basophils Absolute: 0.1 10*3/uL (ref 0.0–0.2)
Basos: 1 %
EOS (ABSOLUTE): 0.1 10*3/uL (ref 0.0–0.4)
Eos: 1 %
Hematocrit: 43.2 % (ref 34.0–46.6)
Hemoglobin: 14.4 g/dL (ref 11.1–15.9)
Immature Grans (Abs): 0 10*3/uL (ref 0.0–0.1)
Immature Granulocytes: 0 %
Lymphocytes Absolute: 2.1 10*3/uL (ref 0.7–3.1)
Lymphs: 26 %
MCH: 32.1 pg (ref 26.6–33.0)
MCHC: 33.3 g/dL (ref 31.5–35.7)
MCV: 96 fL (ref 79–97)
Monocytes Absolute: 0.4 10*3/uL (ref 0.1–0.9)
Monocytes: 5 %
Neutrophils Absolute: 5.5 10*3/uL (ref 1.4–7.0)
Neutrophils: 67 %
Platelets: 347 10*3/uL (ref 150–450)
RBC: 4.48 x10E6/uL (ref 3.77–5.28)
RDW: 11.6 % — ABNORMAL LOW (ref 11.7–15.4)
WBC: 8.1 10*3/uL (ref 3.4–10.8)

## 2019-09-21 LAB — LIPID PANEL
Chol/HDL Ratio: 4.9 ratio — ABNORMAL HIGH (ref 0.0–4.4)
Cholesterol, Total: 219 mg/dL — ABNORMAL HIGH (ref 100–199)
HDL: 45 mg/dL (ref >39)
LDL Chol Calc (NIH): 150 mg/dL — ABNORMAL HIGH (ref 0–99)
Triglycerides: 131 mg/dL (ref 0–149)
VLDL Cholesterol Cal: 24 mg/dL (ref 5–40)

## 2019-09-23 LAB — COMPLIANCE DRUG ANALYSIS, UR

## 2019-09-26 ENCOUNTER — Encounter: Payer: Self-pay | Admitting: Family Medicine

## 2019-10-11 ENCOUNTER — Ambulatory Visit: Payer: Medicare Other | Admitting: Family Medicine

## 2019-10-13 ENCOUNTER — Ambulatory Visit: Payer: Medicare Other | Admitting: Family Medicine

## 2019-10-19 ENCOUNTER — Other Ambulatory Visit: Payer: Self-pay

## 2019-10-19 ENCOUNTER — Encounter: Payer: Self-pay | Admitting: Family Medicine

## 2019-10-19 ENCOUNTER — Ambulatory Visit (INDEPENDENT_AMBULATORY_CARE_PROVIDER_SITE_OTHER): Payer: Medicare Other | Admitting: Family Medicine

## 2019-10-19 VITALS — BP 119/82 | HR 107 | Temp 97.8°F | Resp 18 | Ht 61.0 in | Wt 141.0 lb

## 2019-10-19 DIAGNOSIS — E782 Mixed hyperlipidemia: Secondary | ICD-10-CM | POA: Diagnosis not present

## 2019-10-19 DIAGNOSIS — F988 Other specified behavioral and emotional disorders with onset usually occurring in childhood and adolescence: Secondary | ICD-10-CM

## 2019-10-19 DIAGNOSIS — F419 Anxiety disorder, unspecified: Secondary | ICD-10-CM | POA: Diagnosis not present

## 2019-10-19 MED ORDER — ESCITALOPRAM OXALATE 10 MG PO TABS: 10.0000 mg | ORAL_TABLET | Freq: Every day | ORAL | 2 refills | Status: DC

## 2019-10-19 NOTE — Progress Notes (Signed)
Assessment & Plan:  1. Attention deficit disorder, unspecified hyperactivity presence - Well controlled on current regimen.  While patient did report a decreased appetite since she has been on the medication she has only lost 1 lb since she started the medication.  2. Anxiety - Uncontrolled.  Starting Lexapro today.  This will be the first medication the patient has ever tried for anxiety. - escitalopram (LEXAPRO) 10 MG tablet; Take 1 tablet (10 mg total) by mouth daily.  Dispense: 30 tablet; Refill: 2  3.  Mixed hyperlipidemia - Patient did have a mildly elevated cholesterol levels with her lab work completed at her last visit.  Education provided today on her AWV for high cholesterol.  Encouraged dietary changes and regular exercise.   Return in about 6 weeks (around 11/30/2019) for anxiety.  Hendricks Limes, MSN, APRN, FNP-C [facility]  Subjective:    Patient ID: Kayla Shepherd, female    DOB: 1980-08-11, 39 y.o.   MRN: [id number]  Patient Care Team: Loman Brooklyn, FNP as PCP - General (Family Medicine)   Chief Complaint:  Chief Complaint  Patient presents with  . Medical Management of Chronic Issues    3-4 week follow up     HPI: Kayla Shepherd is a 39 y.o. female presenting on 10/19/2019 for Medical Management of Chronic Issues (3-4 week follow up )  Patient is accompanied by her mother who she is okay with being present.  Patient is following up on ADD.  Last month she was switched from Ritalin to Strattera so that she would have 1 less controlled substance and not have to go through a prior authorization process every year.  She does report she is eating less, but this only means she is not getting the second and third helpings of her food.  She is still eating a full meal with each meal.  She does also report that after lunch she is quieter and does not want to talk to anyone, but after she takes a nap she is fine.  She would like to continue the  medication as she is not bothered by the things that she reports.  She is only reporting them because she noticed them.  We also wanted to discuss starting patient on a daily medication to treat anxiety today.  She is still wanting to do this.  Depression screen [facility] 2/9 10/19/2019 09/26/2019 07/23/2018  Decreased Interest 1 0 0  Down, Depressed, Hopeless 0 0 0  PHQ - 2 Score 1 0 0  Altered sleeping 0 0 -  Tired, decreased energy 1 1 -  Change in appetite 1 1 -  Feeling bad or failure about yourself  2 1 -  Trouble concentrating 1 0 -  Moving slowly or fidgety/restless 2 0 -  Suicidal thoughts 0 0 -  PHQ-9 Score 8 3 -  Difficult doing work/chores Somewhat difficult Not difficult at all -   GAD 7 : Generalized Anxiety Score 10/19/2019 09/26/2019  Nervous, Anxious, on Edge 3 3  Control/stop worrying 3 3  Worry too much - different things 3 3  Trouble relaxing 1 3  Restless 1 2  Easily annoyed or irritable 2 3  Afraid - awful might happen 2 3  Total GAD 7 Score 15 20  Anxiety Difficulty Very difficult Somewhat difficult    New complaints: None  Social history:  Relevant past medical, surgical, family and social history reviewed and updated as indicated. Interim medical history since our  last visit reviewed.  Allergies and medications reviewed and updated.  DATA REVIEWED: CHART IN EPIC  ROS: Negative unless specifically indicated above in HPI.    Current Outpatient Medications:  .  atomoxetine (STRATTERA) 40 MG capsule, Take 1 capsule (40 mg total) by mouth daily., Disp: 30 capsule, Rfl: 2 .  buPROPion (WELLBUTRIN XL) 300 MG 24 hr tablet, Take 1 tablet (300 mg total) by mouth daily., Disp: 90 tablet, Rfl: 0 .  LORazepam (ATIVAN) 0.5 MG tablet, TAKE 1 TABLET TWICE DAILY AS NEEDED FOR ANXIETY, Disp: 15 tablet, Rfl: 2 .  Norgestimate-Ethinyl Estradiol Triphasic 0.18/0.215/0.25 MG-25 MCG tab, Take 1 tablet by mouth daily., Disp: 1 Package, Rfl: 12   No Known Allergies Past Medical  History:  Diagnosis Date  . ADD (attention deficit disorder)   . Depression     History reviewed. No pertinent surgical history.  Social History   Socioeconomic History  . Marital status: Single    Spouse name: Not on file  . Number of children: Not on file  . Years of education: Not on file  . Highest education level: Not on file  Occupational History  . Not on file  Tobacco Use  . Smoking status: Never Smoker  . Smokeless tobacco: Never Used  Vaping Use  . Vaping Use: Never used  Substance and Sexual Activity  . Alcohol use: No  . Drug use: No  . Sexual activity: Not on file  Other Topics Concern  . Not on file  Social History Narrative  . Not on file   Social Determinants of Health   Financial Resource Strain:   . Difficulty of Paying Living Expenses:   Food Insecurity:   . Worried About Charity fundraiser in the Last Year:   . Arboriculturist in the Last Year:   Transportation Needs:   . Film/video editor (Medical):   Marland Kitchen Lack of Transportation (Non-Medical):   Physical Activity:   . Days of Exercise per Week:   . Minutes of Exercise per Session:   Stress:   . Feeling of Stress :   Social Connections:   . Frequency of Communication with Friends and Family:   . Frequency of Social Gatherings with Friends and Family:   . Attends Religious Services:   . Active Member of Clubs or Organizations:   . Attends Archivist Meetings:   Marland Kitchen Marital Status:   Intimate Partner Violence:   . Fear of Current or Ex-Partner:   . Emotionally Abused:   Marland Kitchen Physically Abused:   . Sexually Abused:         Objective:    BP 119/82 (BP Location: Left Arm, Cuff Size: Normal)   Pulse (!) 107   Temp 97.8 F (36.6 C)   Resp 18   Ht 5\' 1"  (1.549 m)   Wt 141 lb (64 kg)   LMP 09/28/2019   SpO2 99%   BMI 26.64 kg/m   Wt Readings from Last 3 Encounters:  10/19/19 141 lb (64 kg)  09/20/19 142 lb 3.2 oz (64.5 kg)  07/23/18 154 lb 3.2 oz (69.9 kg)    Physical  Exam Vitals reviewed.  Constitutional:      General: She is not in acute distress.    Appearance: Normal appearance. She is overweight. She is not ill-appearing, toxic-appearing or diaphoretic.  HENT:     Head: Normocephalic and atraumatic.  Eyes:     General: No scleral icterus.  Right eye: No discharge.        Left eye: No discharge.     Conjunctiva/sclera: Conjunctivae normal.  Cardiovascular:     Rate and Rhythm: Normal rate and regular rhythm.     Heart sounds: Normal heart sounds. No murmur heard.  No friction rub. No gallop.   Pulmonary:     Effort: Pulmonary effort is normal. No respiratory distress.     Breath sounds: Normal breath sounds. No stridor. No wheezing, rhonchi or rales.  Musculoskeletal:        General: Normal range of motion.     Cervical back: Normal range of motion.  Skin:    General: Skin is warm and dry.     Capillary Refill: Capillary refill takes less than 2 seconds.  Neurological:     General: No focal deficit present.     Mental Status: She is alert and oriented to person, place, and time. Mental status is at baseline.  Psychiatric:        Mood and Affect: Mood normal.        Behavior: Behavior normal.        Thought Content: Thought content normal.        Judgment: Judgment normal.     No results found for: TSH Lab Results  Component Value Date   WBC 8.1 09/20/2019   HGB 14.4 09/20/2019   HCT 43.2 09/20/2019   MCV 96 09/20/2019   PLT 347 09/20/2019   Lab Results  Component Value Date   NA 145 (H) 09/20/2019   K 4.4 09/20/2019   CO2 22 09/20/2019   GLUCOSE 90 09/20/2019   BUN 8 09/20/2019   CREATININE 0.86 09/20/2019   BILITOT <0.2 09/20/2019   ALKPHOS 75 09/20/2019   AST 19 09/20/2019   ALT 13 09/20/2019   PROT 7.7 09/20/2019   ALBUMIN 4.8 09/20/2019   CALCIUM 9.8 09/20/2019   Lab Results  Component Value Date   CHOL 219 (H) 09/20/2019   Lab Results  Component Value Date   HDL 45 09/20/2019   Lab Results    Component Value Date   LDLCALC 150 (H) 09/20/2019   Lab Results  Component Value Date   TRIG 131 09/20/2019   Lab Results  Component Value Date   CHOLHDL 4.9 (H) 09/20/2019   No results found for: HGBA1C

## 2019-10-19 NOTE — Patient Instructions (Signed)

## 2019-11-30 ENCOUNTER — Encounter: Payer: Self-pay | Admitting: Family Medicine

## 2019-11-30 ENCOUNTER — Ambulatory Visit (INDEPENDENT_AMBULATORY_CARE_PROVIDER_SITE_OTHER): Payer: Medicare Other | Admitting: Family Medicine

## 2019-11-30 ENCOUNTER — Other Ambulatory Visit: Payer: Self-pay

## 2019-11-30 VITALS — BP 134/89 | HR 96 | Temp 97.9°F | Ht 61.0 in | Wt 140.4 lb

## 2019-11-30 DIAGNOSIS — F419 Anxiety disorder, unspecified: Secondary | ICD-10-CM | POA: Diagnosis not present

## 2019-11-30 NOTE — Progress Notes (Signed)
Assessment & Plan:  1. Anxiety - Well controlled on current regimen.    Return in about 6 months (around 06/01/2020) for annual physical.  Education provided on Pap smear and patient was encouraged to think about it so she knows whether she wants to do this at her next appointment.  Hendricks Limes, MSN, APRN, FNP-C [facility]  Subjective:    Patient ID: Kayla Shepherd, female    DOB: June 10, 1980, 39 y.o.   MRN: [id number]  Patient Care Team: Loman Brooklyn, FNP as PCP - General (Family Medicine)   Chief Complaint:  Chief Complaint  Patient presents with  . Anxiety    6 week follow up- Patient states that the lexapro has helped.    HPI: Kayla Shepherd is a 39 y.o. female presenting on 11/30/2019 for Anxiety (6 week follow up- Patient states that the lexapro has helped.)  Patient is accompanied by her mother who she is okay with being present.  Patient feels she is doing well with the Lexapro.  Her mother agrees that she thinks it is helping.   GAD 7 : Generalized Anxiety Score 11/30/2019 10/19/2019 09/26/2019  Nervous, Anxious, on Edge 0 3 3  Control/stop worrying 0 3 3  Worry too much - different things 0 3 3  Trouble relaxing 0 1 3  Restless 0 1 2  Easily annoyed or irritable 0 2 3  Afraid - awful might happen 0 2 3  Total GAD 7 Score 0 15 20  Anxiety Difficulty - Very difficult Somewhat difficult    New complaints: None  Social history:  Relevant past medical, surgical, family and social history reviewed and updated as indicated. Interim medical history since our last visit reviewed.  Allergies and medications reviewed and updated.  DATA REVIEWED: CHART IN EPIC  ROS: Negative unless specifically indicated above in HPI.    Current Outpatient Medications:  .  atomoxetine (STRATTERA) 40 MG capsule, Take 1 capsule (40 mg total) by mouth daily., Disp: 30 capsule, Rfl: 2 .  buPROPion (WELLBUTRIN XL) 300 MG 24 hr tablet, Take 1 tablet (300  mg total) by mouth daily., Disp: 90 tablet, Rfl: 0 .  escitalopram (LEXAPRO) 10 MG tablet, Take 1 tablet (10 mg total) by mouth daily., Disp: 30 tablet, Rfl: 2 .  LORazepam (ATIVAN) 0.5 MG tablet, TAKE 1 TABLET TWICE DAILY AS NEEDED FOR ANXIETY, Disp: 15 tablet, Rfl: 2 .  Norgestimate-Ethinyl Estradiol Triphasic 0.18/0.215/0.25 MG-25 MCG tab, Take 1 tablet by mouth daily., Disp: 1 Package, Rfl: 12   No Known Allergies Past Medical History:  Diagnosis Date  . ADD (attention deficit disorder)   . Depression     History reviewed. No pertinent surgical history.  Social History   Socioeconomic History  . Marital status: Single    Spouse name: Not on file  . Number of children: Not on file  . Years of education: Not on file  . Highest education level: Not on file  Occupational History  . Not on file  Tobacco Use  . Smoking status: Never Smoker  . Smokeless tobacco: Never Used  Vaping Use  . Vaping Use: Never used  Substance and Sexual Activity  . Alcohol use: No  . Drug use: No  . Sexual activity: Not on file  Other Topics Concern  . Not on file  Social History Narrative  . Not on file   Social Determinants of Health   Financial Resource Strain:   . Difficulty of Paying  Living Expenses:   Food Insecurity:   . Worried About Charity fundraiser in the Last Year:   . Arboriculturist in the Last Year:   Transportation Needs:   . Film/video editor (Medical):   Marland Kitchen Lack of Transportation (Non-Medical):   Physical Activity:   . Days of Exercise per Week:   . Minutes of Exercise per Session:   Stress:   . Feeling of Stress :   Social Connections:   . Frequency of Communication with Friends and Family:   . Frequency of Social Gatherings with Friends and Family:   . Attends Religious Services:   . Active Member of Clubs or Organizations:   . Attends Archivist Meetings:   Marland Kitchen Marital Status:   Intimate Partner Violence:   . Fear of Current or Ex-Partner:   .  Emotionally Abused:   Marland Kitchen Physically Abused:   . Sexually Abused:         Objective:    BP 134/89   Pulse 96   Temp 97.9 F (36.6 C) (Temporal)   Ht 5\' 1"  (1.549 m)   Wt 140 lb 6.4 oz (63.7 kg)   SpO2 100%   BMI 26.53 kg/m   Wt Readings from Last 3 Encounters:  11/30/19 140 lb 6.4 oz (63.7 kg)  10/19/19 141 lb (64 kg)  09/20/19 142 lb 3.2 oz (64.5 kg)    Physical Exam Vitals reviewed.  Constitutional:      General: She is not in acute distress.    Appearance: Normal appearance. She is overweight. She is not ill-appearing, toxic-appearing or diaphoretic.  HENT:     Head: Normocephalic and atraumatic.  Eyes:     General: No scleral icterus.       Right eye: No discharge.        Left eye: No discharge.     Conjunctiva/sclera: Conjunctivae normal.  Cardiovascular:     Rate and Rhythm: Normal rate.  Pulmonary:     Effort: Pulmonary effort is normal. No respiratory distress.  Musculoskeletal:        General: Normal range of motion.     Cervical back: Normal range of motion.  Skin:    General: Skin is warm and dry.     Capillary Refill: Capillary refill takes less than 2 seconds.  Neurological:     General: No focal deficit present.     Mental Status: She is alert and oriented to person, place, and time. Mental status is at baseline.  Psychiatric:        Mood and Affect: Mood normal.        Behavior: Behavior normal.        Thought Content: Thought content normal.        Judgment: Judgment normal.     No results found for: TSH Lab Results  Component Value Date   WBC 8.1 09/20/2019   HGB 14.4 09/20/2019   HCT 43.2 09/20/2019   MCV 96 09/20/2019   PLT 347 09/20/2019   Lab Results  Component Value Date   NA 145 (H) 09/20/2019   K 4.4 09/20/2019   CO2 22 09/20/2019   GLUCOSE 90 09/20/2019   BUN 8 09/20/2019   CREATININE 0.86 09/20/2019   BILITOT <0.2 09/20/2019   ALKPHOS 75 09/20/2019   AST 19 09/20/2019   ALT 13 09/20/2019   PROT 7.7 09/20/2019    ALBUMIN 4.8 09/20/2019   CALCIUM 9.8 09/20/2019   Lab Results  Component Value Date  CHOL 219 (H) 09/20/2019   Lab Results  Component Value Date   HDL 45 09/20/2019   Lab Results  Component Value Date   LDLCALC 150 (H) 09/20/2019   Lab Results  Component Value Date   TRIG 131 09/20/2019   Lab Results  Component Value Date   CHOLHDL 4.9 (H) 09/20/2019   No results found for: HGBA1C

## 2019-11-30 NOTE — Patient Instructions (Signed)
Discuss/think about a pap smear.    Pap Test Why am I having this test? A Pap test, also called a Pap smear, is a screening test to check for signs of:  Cancer of the vagina, cervix, and uterus. The cervix is the lower part of the uterus that opens into the vagina.  Infection.  Changes that may be a sign that cancer is developing (precancerous changes). Women need this test on a regular basis. In general, you should have a Pap test every 3 years until you reach menopause or age 20. Women aged 30-60 may choose to have their Pap test done at the same time as an HPV (human papillomavirus) test every 5 years (instead of every 3 years). Your health care provider may recommend having Pap tests more or less often depending on your medical conditions and past Pap test results. What kind of sample is taken?  Your health care provider will collect a sample of cells from the surface of your cervix. This will be done using a small cotton swab, plastic spatula, or brush. This sample is often collected during a pelvic exam, when you are lying on your back on an exam table with feet in footrests (stirrups). In some cases, fluids (secretions) from the cervix or vagina may also be collected. How do I prepare for this test?  Be aware of where you are in your menstrual cycle. If you are menstruating on the day of the test, you may be asked to reschedule.  You may need to reschedule if you have a known vaginal infection on the day of the test.  Follow instructions from your health care provider about: ? Changing or stopping your regular medicines. Some medicines can cause abnormal test results, such as digitalis and tetracycline. ? Avoiding douching or taking a bath the day before or the day of the test. Tell a health care provider about:  Any allergies you have.  All medicines you are taking, including vitamins, herbs, eye drops, creams, and over-the-counter medicines.  Any blood disorders you  have.  Any surgeries you have had.  Any medical conditions you have.  Whether you are pregnant or may be pregnant. How are the results reported? Your test results will be reported as either abnormal or normal. A false-positive result can occur. A false positive is incorrect because it means that a condition is present when it is not. A false-negative result can occur. A false negative is incorrect because it means that a condition is not present when it is. What do the results mean? A normal test result means that you do not have signs of cancer of the vagina, cervix, or uterus. An abnormal result may mean that you have:  Cancer. A Pap test by itself is not enough to diagnose cancer. You will have more tests done in this case.  Precancerous changes in your vagina, cervix, or uterus.  Inflammation of the cervix.  An STD (sexually transmitted disease).  A fungal infection.  A parasite infection. Talk with your health care provider about what your results mean. Questions to ask your health care provider Ask your health care provider, or the department that is doing the test:  When will my results be ready?  How will I get my results?  What are my treatment options?  What other tests do I need?  What are my next steps? Summary  In general, women should have a Pap test every 3 years until they reach menopause or age 73.  Your health care provider will collect a sample of cells from the surface of your cervix. This will be done using a small cotton swab, plastic spatula, or brush.  In some cases, fluids (secretions) from the cervix or vagina may also be collected. This information is not intended to replace advice given to you by your health care provider. Make sure you discuss any questions you have with your health care provider. Document Revised: 01/05/2017 Document Reviewed: 01/05/2017 Elsevier Patient Education  2020 Elsevier Inc.  

## 2019-12-31 ENCOUNTER — Other Ambulatory Visit: Payer: Self-pay | Admitting: Family Medicine

## 2019-12-31 DIAGNOSIS — F988 Other specified behavioral and emotional disorders with onset usually occurring in childhood and adolescence: Secondary | ICD-10-CM

## 2020-01-11 ENCOUNTER — Other Ambulatory Visit: Payer: Self-pay | Admitting: *Deleted

## 2020-01-12 ENCOUNTER — Other Ambulatory Visit: Payer: Self-pay

## 2020-01-13 ENCOUNTER — Other Ambulatory Visit: Payer: Self-pay

## 2020-01-13 MED ORDER — NORGESTIM-ETH ESTRAD TRIPHASIC 0.18/0.215/0.25 MG-25 MCG PO TABS: 1.0000 | ORAL_TABLET | Freq: Every day | ORAL | 2 refills | Status: DC

## 2020-01-18 ENCOUNTER — Other Ambulatory Visit: Payer: Self-pay | Admitting: Family Medicine

## 2020-01-18 DIAGNOSIS — F419 Anxiety disorder, unspecified: Secondary | ICD-10-CM

## 2020-01-28 ENCOUNTER — Other Ambulatory Visit: Payer: Self-pay | Admitting: Family Medicine

## 2020-01-28 DIAGNOSIS — F419 Anxiety disorder, unspecified: Secondary | ICD-10-CM

## 2020-03-05 ENCOUNTER — Other Ambulatory Visit: Payer: Self-pay | Admitting: Family Medicine

## 2020-03-05 DIAGNOSIS — F988 Other specified behavioral and emotional disorders with onset usually occurring in childhood and adolescence: Secondary | ICD-10-CM

## 2020-04-23 ENCOUNTER — Other Ambulatory Visit: Payer: Self-pay | Admitting: Family Medicine

## 2020-04-23 DIAGNOSIS — F419 Anxiety disorder, unspecified: Secondary | ICD-10-CM

## 2020-04-28 ENCOUNTER — Other Ambulatory Visit: Payer: Self-pay | Admitting: Family Medicine

## 2020-05-21 ENCOUNTER — Other Ambulatory Visit: Payer: Self-pay | Admitting: Family Medicine

## 2020-05-21 DIAGNOSIS — F419 Anxiety disorder, unspecified: Secondary | ICD-10-CM

## 2020-06-01 ENCOUNTER — Encounter: Payer: Self-pay | Admitting: Family Medicine

## 2020-06-01 ENCOUNTER — Other Ambulatory Visit: Payer: Self-pay

## 2020-06-01 ENCOUNTER — Ambulatory Visit (INDEPENDENT_AMBULATORY_CARE_PROVIDER_SITE_OTHER): Payer: Medicare Other | Admitting: Family Medicine

## 2020-06-01 VITALS — BP 138/95 | HR 127 | Temp 98.4°F | Ht 61.0 in | Wt 132.6 lb

## 2020-06-01 DIAGNOSIS — Z23 Encounter for immunization: Secondary | ICD-10-CM

## 2020-06-01 DIAGNOSIS — F988 Other specified behavioral and emotional disorders with onset usually occurring in childhood and adolescence: Secondary | ICD-10-CM | POA: Diagnosis not present

## 2020-06-01 DIAGNOSIS — F419 Anxiety disorder, unspecified: Secondary | ICD-10-CM

## 2020-06-01 DIAGNOSIS — Z0001 Encounter for general adult medical examination with abnormal findings: Secondary | ICD-10-CM | POA: Diagnosis not present

## 2020-06-01 DIAGNOSIS — L821 Other seborrheic keratosis: Secondary | ICD-10-CM

## 2020-06-01 DIAGNOSIS — Z0000 Encounter for general adult medical examination without abnormal findings: Secondary | ICD-10-CM

## 2020-06-01 DIAGNOSIS — F3342 Major depressive disorder, recurrent, in full remission: Secondary | ICD-10-CM

## 2020-06-01 MED ORDER — LORAZEPAM 0.5 MG PO TABS
0.5000 mg | ORAL_TABLET | Freq: Two times a day (BID) | ORAL | 2 refills | Status: DC | PRN
Start: 2020-06-01 — End: 2020-11-29

## 2020-06-01 MED ORDER — ESCITALOPRAM OXALATE 10 MG PO TABS: 10.0000 mg | ORAL_TABLET | Freq: Every day | ORAL | 3 refills | Status: DC

## 2020-06-01 MED ORDER — BUPROPION HCL ER (XL) 300 MG PO TB24
300.0000 mg | ORAL_TABLET | Freq: Every day | ORAL | 3 refills | Status: DC
Start: 2020-06-01 — End: 2021-07-09

## 2020-06-01 MED ORDER — ATOMOXETINE HCL 40 MG PO CAPS: 40.0000 mg | ORAL_CAPSULE | Freq: Every day | ORAL | 3 refills | Status: DC

## 2020-06-01 NOTE — Progress Notes (Signed)
Assessment & Plan:  1. Well adult exam - Preventive health education provided. Taught how to do self breast exams. Patient declined pap smear, TDAP, HIV and hepatitis C screening.  - CBC with Differential/Platelet - CMP14+EGFR - Lipid panel - Thyroid Panel With TSH  2. Anxiety - Well controlled on current regimen. Patient has a controlled substance agreement in place for as needed use of Ativan.  - escitalopram (LEXAPRO) 10 MG tablet; Take 1 tablet (10 mg total) by mouth daily.  Dispense: 90 tablet; Refill: 3 - buPROPion (WELLBUTRIN XL) 300 MG 24 hr tablet; Take 1 tablet (300 mg total) by mouth daily.  Dispense: 90 tablet; Refill: 3 - LORazepam (ATIVAN) 0.5 MG tablet; Take 1 tablet (0.5 mg total) by mouth 2 (two) times daily as needed for anxiety.  Dispense: 15 tablet; Refill: 2  3. Recurrent major depressive disorder, in full remission ([place]) - Well controlled on current regimen.   4. Attention deficit disorder, unspecified hyperactivity presence - Well controlled on current regimen.  - atomoxetine (STRATTERA) 40 MG capsule; Take 1 capsule (40 mg total) by mouth daily.  Dispense: 90 capsule; Refill: 3  5. Seborrheic keratoses - Education and reassurance provided.   6. Need for immunization against influenza - Flu Vaccine QUAD 36+ mos IM   Oral birth control D/C'd.  Follow-up: Return in about 6 months (around 11/29/2020) for follow-up of chronic medication conditions.   Hendricks Limes, MSN, APRN, FNP-C [facility]  Subjective:  Patient ID: Kayla Shepherd, female    DOB: April 23, 1981  Age: 40 y.o. MRN: [id number]  Patient Care Team: Loman Brooklyn, FNP as PCP - General (Family Medicine)   CC:  Chief Complaint  Patient presents with  . Annual Exam    HPI [name] presents for her annual physical.   Occupation: none, Marital status: single, Substance use: none Diet: eats healthy, small portions Last pap smear: never - declined Hepatitis  C Screening: declined Immunizations: Flu Vaccine: getting today Tdap Vaccine: declined  COVID-19 Vaccine: up to date  Depression screen [facility] 2/9 06/01/2020 11/30/2019 10/19/2019  Decreased Interest 0 0 1  Down, Depressed, Hopeless 0 1 0  PHQ - 2 Score 0 1 1  Altered sleeping 0 0 0  Tired, decreased energy 0 0 1  Change in appetite 0 0 1  Feeling bad or failure about yourself  0 0 2  Trouble concentrating 0 0 1  Moving slowly or fidgety/restless 0 0 2  Suicidal thoughts 0 0 0  PHQ-9 Score 0 1 8  Difficult doing work/chores Not difficult at all - Somewhat difficult   GAD 7 : Generalized Anxiety Score 06/01/2020 11/30/2019 10/19/2019 09/26/2019  Nervous, Anxious, on Edge 0 0 3 3  Control/stop worrying 0 0 3 3  Worry too much - different things 0 0 3 3  Trouble relaxing 0 0 1 3  Restless 0 0 1 2  Easily annoyed or irritable 0 0 2 3  Afraid - awful might happen 0 0 2 3  Total GAD 7 Score 0 0 15 20  Anxiety Difficulty - - Very difficult Somewhat difficult    Menstrual Cycle: patient would like to try not taking oral birth control since she is not sexually active. She was initially prescribed the medication when she was in school due to her heavy periods.   Review of Systems  Constitutional: Negative for chills, fever, malaise/fatigue and weight loss.  HENT: Negative for congestion, ear discharge, ear pain, nosebleeds,  sinus pain, sore throat and tinnitus.   Eyes: Negative for blurred vision, double vision, pain, discharge and redness.  Respiratory: Negative for cough, shortness of breath and wheezing.   Cardiovascular: Negative for chest pain, palpitations and leg swelling.  Gastrointestinal: Negative for abdominal pain, constipation, diarrhea, heartburn, nausea and vomiting.  Genitourinary: Negative for dysuria, frequency and urgency.  Musculoskeletal: Negative for myalgias.  Skin: Negative for rash.  Neurological: Negative for dizziness, seizures, weakness and headaches.   Psychiatric/Behavioral: Negative for depression, substance abuse and suicidal ideas. The patient is not nervous/anxious.      Current Outpatient Medications:  .  atomoxetine (STRATTERA) 40 MG capsule, Take 1 capsule (40 mg total) by mouth daily., Disp: 90 capsule, Rfl: 1 .  buPROPion (WELLBUTRIN XL) 300 MG 24 hr tablet, TAKE 1 TABLET DAILY, Disp: 90 tablet, Rfl: 0 .  escitalopram (LEXAPRO) 10 MG tablet, TAKE 1 TABLET DAILY, Disp: 30 tablet, Rfl: 0 .  LORazepam (ATIVAN) 0.5 MG tablet, TAKE 1 TABLET TWICE DAILY AS NEEDED FOR ANXIETY, Disp: 15 tablet, Rfl: 0 .  TRI-LO-SPRINTEC 0.18/0.215/0.25 MG-25 MCG tab, TAKE 1 TABLET ONCE DAILY, Disp: 28 tablet, Rfl: 0  No Known Allergies  Past Medical History:  Diagnosis Date  . ADD (attention deficit disorder)   . Depression     History reviewed. No pertinent surgical history.  Family History  Problem Relation Age of Onset  . Hyperlipidemia Father     Social History   Socioeconomic History  . Marital status: Single    Spouse name: Not on file  . Number of children: Not on file  . Years of education: Not on file  . Highest education level: Not on file  Occupational History  . Not on file  Tobacco Use  . Smoking status: Never Smoker  . Smokeless tobacco: Never Used  Vaping Use  . Vaping Use: Never used  Substance and Sexual Activity  . Alcohol use: No  . Drug use: No  . Sexual activity: Not on file  Other Topics Concern  . Not on file  Social History Narrative  . Not on file   Social Determinants of Health   Financial Resource Strain: Not on file  Food Insecurity: Not on file  Transportation Needs: Not on file  Physical Activity: Not on file  Stress: Not on file  Social Connections: Not on file  Intimate Partner Violence: Not on file      Objective:    BP (!) 138/95   Pulse (!) 127   Temp 98.4 F (36.9 C) (Temporal)   Ht $R'5\' 1"'QI$  (1.549 m)   Wt 132 lb 9.6 oz (60.1 kg)   SpO2 98%   BMI 25.05 kg/m   Wt Readings  from Last 3 Encounters:  06/01/20 132 lb 9.6 oz (60.1 kg)  11/30/19 140 lb 6.4 oz (63.7 kg)  10/19/19 141 lb (64 kg)    Physical Exam Vitals reviewed. Exam conducted with a chaperone present.  Constitutional:      General: She is not in acute distress.    Appearance: Normal appearance. She is normal weight. She is not ill-appearing, toxic-appearing or diaphoretic.  HENT:     Head: Normocephalic and atraumatic.     Right Ear: Tympanic membrane, ear canal and external ear normal. There is no impacted cerumen.     Left Ear: Tympanic membrane, ear canal and external ear normal. There is no impacted cerumen.     Nose: Nose normal. No congestion or rhinorrhea.     Mouth/Throat:  Mouth: Mucous membranes are moist.     Pharynx: Oropharynx is clear. No oropharyngeal exudate or posterior oropharyngeal erythema.  Eyes:     General: No scleral icterus.       Right eye: No discharge.        Left eye: No discharge.     Conjunctiva/sclera: Conjunctivae normal.     Pupils: Pupils are equal, round, and reactive to light.  Cardiovascular:     Rate and Rhythm: Normal rate and regular rhythm.     Heart sounds: Normal heart sounds. No murmur heard. No friction rub. No gallop.   Pulmonary:     Effort: Pulmonary effort is normal. No respiratory distress.     Breath sounds: Normal breath sounds. No stridor. No wheezing, rhonchi or rales.  Chest:     Chest wall: No mass, lacerations, deformity, swelling, tenderness, crepitus or edema.  Breasts: Breasts are symmetrical.     Right: Normal. No axillary adenopathy or supraclavicular adenopathy.     Left: Normal. No axillary adenopathy or supraclavicular adenopathy.    Abdominal:     General: Abdomen is flat. Bowel sounds are normal. There is no distension.     Palpations: Abdomen is soft. There is no mass.     Tenderness: There is no abdominal tenderness. There is no guarding or rebound.     Hernia: No hernia is present.  Musculoskeletal:         General: Normal range of motion.     Cervical back: Normal range of motion and neck supple. No rigidity. No muscular tenderness.  Lymphadenopathy:     Cervical: No cervical adenopathy.     Upper Body:     Right upper body: No supraclavicular, axillary or pectoral adenopathy.     Left upper body: No supraclavicular, axillary or pectoral adenopathy.  Skin:    General: Skin is warm and dry.     Capillary Refill: Capillary refill takes less than 2 seconds.     Comments: Small seborrhoic keratoses and skin tags on torso.   Neurological:     General: No focal deficit present.     Mental Status: She is alert and oriented to person, place, and time. Mental status is at baseline.  Psychiatric:        Mood and Affect: Mood normal.        Behavior: Behavior normal.        Thought Content: Thought content normal.        Judgment: Judgment normal.     No results found for: TSH Lab Results  Component Value Date   WBC 8.1 09/20/2019   HGB 14.4 09/20/2019   HCT 43.2 09/20/2019   MCV 96 09/20/2019   PLT 347 09/20/2019   Lab Results  Component Value Date   NA 145 (H) 09/20/2019   K 4.4 09/20/2019   CO2 22 09/20/2019   GLUCOSE 90 09/20/2019   BUN 8 09/20/2019   CREATININE 0.86 09/20/2019   BILITOT <0.2 09/20/2019   ALKPHOS 75 09/20/2019   AST 19 09/20/2019   ALT 13 09/20/2019   PROT 7.7 09/20/2019   ALBUMIN 4.8 09/20/2019   CALCIUM 9.8 09/20/2019   Lab Results  Component Value Date   CHOL 219 (H) 09/20/2019   Lab Results  Component Value Date   HDL 45 09/20/2019   Lab Results  Component Value Date   LDLCALC 150 (H) 09/20/2019   Lab Results  Component Value Date   TRIG 131 09/20/2019   Lab Results  Component Value Date   CHOLHDL 4.9 (H) 09/20/2019   No results found for: HGBA1C

## 2020-06-01 NOTE — Patient Instructions (Addendum)
Seborrheic Keratosis A seborrheic keratosis is a common, noncancerous (benign) skin growth. These growths are velvety, waxy, rough, tan, brown, or black spots that appear on the skin. These skin growths can be flat or raised, and scaly. What are the causes? The cause of this condition is not known. What increases the risk? You are more likely to develop this condition if you:  Have a family history of seborrheic keratosis.  Are 50 or older.  Are pregnant.  Have had estrogen replacement therapy. What are the signs or symptoms? Symptoms of this condition include growths on the face, chest, shoulders, back, or other areas. These growths:  Are usually painless, but may become irritated and itchy.  Can be yellow, brown, black, or other colors.  Are slightly raised or have a flat surface.  Are sometimes rough or wart-like in texture.  Are often velvety or waxy on the surface.  Are round or oval-shaped.  Often occur in groups, but may occur as a single growth.   How is this diagnosed? This condition is diagnosed with a medical history and physical exam.  A sample of the growth may be tested (skin biopsy).  You may need to see a skin specialist (dermatologist). How is this treated? Treatment is not usually needed for this condition, unless the growths are irritated or bleed often.  You may also choose to have the growths removed if you do not like their appearance. ? Most commonly, these growths are treated with a procedure in which liquid nitrogen is applied to "freeze" off the growth (cryosurgery). ? They may also be burned off with electricity (electrocautery) or removed by scraping (curettage). Follow these instructions at home:  Watch your growth for any changes.  Keep all follow-up visits as told by your health care provider. This is important.  Do not scratch or pick at the growth or growths. This can cause them to become irritated or infected. Contact a health care  provider if:  You suddenly have many new growths.  Your growth bleeds, itches, or hurts.  Your growth suddenly becomes larger or changes color. Summary  A seborrheic keratosis is a common, noncancerous (benign) skin growth.  Treatment is not usually needed for this condition, unless the growths are irritated or bleed often.  Watch your growth for any changes.  Contact a health care provider if you suddenly have many new growths or your growth suddenly becomes larger or changes color.  Keep all follow-up visits as told by your health care provider. This is important. This information is not intended to replace advice given to you by your health care provider. Make sure you discuss any questions you have with your health care provider. Document Revised: 09/10/2017 Document Reviewed: 09/10/2017 Elsevier Patient Education  [place] 34-67 Years Old, Female Preventive care refers to lifestyle choices and visits with your health care provider that can promote health and wellness. This includes:  A yearly physical exam. This is also called an annual wellness visit.  Regular dental and eye exams.  Immunizations.  Screening for certain conditions.  Healthy lifestyle choices, such as: ? Eating a healthy diet. ? Getting regular exercise. ? Not using drugs or products that contain nicotine and tobacco. ? Limiting alcohol use. What can I expect for my preventive care visit? Physical exam Your health care provider may check your:  Height and weight. These may be used to calculate your BMI (body mass index). BMI is a measurement that tells if  you are at a healthy weight.  Heart rate and blood pressure.  Body temperature.  Skin for abnormal spots. Counseling Your health care provider may ask you questions about your:  Past medical problems.  Family's medical history.  Alcohol, tobacco, and drug use.  Emotional well-being.  Home life and  relationship well-being.  Sexual activity.  Diet, exercise, and sleep habits.  Work and work Statistician.  Access to firearms.  Method of birth control.  Menstrual cycle.  Pregnancy history. What immunizations do I need? Vaccines are usually given at various ages, according to a schedule. Your health care provider will recommend vaccines for you based on your age, medical history, and lifestyle or other factors, such as travel or where you work.   What tests do I need? Blood tests  Lipid and cholesterol levels. These may be checked every 5 years starting at age 62.  Hepatitis C test.  Hepatitis B test. Screening  Diabetes screening. This is done by checking your blood sugar (glucose) after you have not eaten for a while (fasting).  STD (sexually transmitted disease) testing, if you are at risk.  BRCA-related cancer screening. This may be done if you have a family history of breast, ovarian, tubal, or peritoneal cancers.  Pelvic exam and Pap test. This may be done every 3 years starting at age 70. Starting at age 62, this may be done every 5 years if you have a Pap test in combination with an HPV test. Talk with your health care provider about your test results, treatment options, and if necessary, the need for more tests.   Follow these instructions at home: Eating and drinking  Eat a healthy diet that includes fresh fruits and vegetables, whole grains, lean protein, and low-fat dairy products.  Take vitamin and mineral supplements as recommended by your health care provider.  Do not drink alcohol if: ? Your health care provider tells you not to drink. ? You are pregnant, may be pregnant, or are planning to become pregnant.  If you drink alcohol: ? Limit how much you have to 0-1 drink a day. ? Be aware of how much alcohol is in your drink. In the [place] one drink equals one 12 oz bottle of beer (355 mL), one 5 oz glass of wine (148 mL), or one 1 oz glass of hard liquor  (44 mL).   Lifestyle  Take daily care of your teeth and gums. Brush your teeth every morning and night with fluoride toothpaste. Floss one time each day.  Stay active. Exercise for at least 30 minutes 5 or more days each week.  Do not use any products that contain nicotine or tobacco, such as cigarettes, e-cigarettes, and chewing tobacco. If you need help quitting, ask your health care provider.  Do not use drugs.  If you are sexually active, practice safe sex. Use a condom or other form of protection to prevent STIs (sexually transmitted infections).  If you do not wish to become pregnant, use a form of birth control. If you plan to become pregnant, see your health care provider for a prepregnancy visit.  Find healthy ways to cope with stress, such as: ? Meditation, yoga, or listening to music. ? Journaling. ? Talking to a trusted person. ? Spending time with friends and family. Safety  Always wear your seat belt while driving or riding in a vehicle.  Do not drive: ? If you have been drinking alcohol. Do not ride with someone who has been drinking. ?  When you are tired or distracted. ? While texting.  Wear a helmet and other protective equipment during sports activities.  If you have firearms in your house, make sure you follow all gun safety procedures.  Seek help if you have been physically or sexually abused. What's next?  Go to your health care provider once a year for an annual wellness visit.  Ask your health care provider how often you should have your eyes and teeth checked.  Stay up to date on all vaccines. This information is not intended to replace advice given to you by your health care provider. Make sure you discuss any questions you have with your health care provider. Document Revised: 12/25/2019 Document Reviewed: 01/07/2018 Elsevier Patient Education  2021 [employer].

## 2020-06-02 LAB — CBC WITH DIFFERENTIAL/PLATELET
Basophils Absolute: 0.1 10*3/uL (ref 0.0–0.2)
Basos: 1 %
EOS (ABSOLUTE): 0.1 10*3/uL (ref 0.0–0.4)
Eos: 1 %
Hematocrit: 39 % (ref 34.0–46.6)
Hemoglobin: 13.6 g/dL (ref 11.1–15.9)
Immature Grans (Abs): 0 10*3/uL (ref 0.0–0.1)
Immature Granulocytes: 0 %
Lymphocytes Absolute: 2.3 10*3/uL (ref 0.7–3.1)
Lymphs: 25 %
MCH: 31.6 pg (ref 26.6–33.0)
MCHC: 34.9 g/dL (ref 31.5–35.7)
MCV: 91 fL (ref 79–97)
Monocytes Absolute: 0.6 10*3/uL (ref 0.1–0.9)
Monocytes: 7 %
Neutrophils Absolute: 5.9 10*3/uL (ref 1.4–7.0)
Neutrophils: 66 %
Platelets: 445 10*3/uL (ref 150–450)
RBC: 4.3 x10E6/uL (ref 3.77–5.28)
RDW: 11.5 % — ABNORMAL LOW (ref 11.7–15.4)
WBC: 8.9 10*3/uL (ref 3.4–10.8)

## 2020-06-02 LAB — CMP14+EGFR
ALT: 27 IU/L (ref 0–32)
AST: 28 IU/L (ref 0–40)
Albumin/Globulin Ratio: 1.7 (ref 1.2–2.2)
Albumin: 4.7 g/dL (ref 3.8–4.8)
Alkaline Phosphatase: 80 IU/L (ref 44–121)
BUN/Creatinine Ratio: 7 — ABNORMAL LOW (ref 9–23)
BUN: 7 mg/dL (ref 6–20)
Bilirubin Total: 0.4 mg/dL (ref 0.0–1.2)
CO2: 26 mmol/L (ref 20–29)
Calcium: 9.4 mg/dL (ref 8.7–10.2)
Chloride: 99 mmol/L (ref 96–106)
Creatinine, Ser: 0.94 mg/dL (ref 0.57–1.00)
GFR calc Af Amer: 90 mL/min/{1.73_m2} (ref >59)
GFR calc non Af Amer: 78 mL/min/{1.73_m2} (ref >59)
Globulin, Total: 2.7 g/dL (ref 1.5–4.5)
Glucose: 102 mg/dL — ABNORMAL HIGH (ref 65–99)
Potassium: 3.7 mmol/L (ref 3.5–5.2)
Sodium: 142 mmol/L (ref 134–144)
Total Protein: 7.4 g/dL (ref 6.0–8.5)

## 2020-06-02 LAB — LIPID PANEL
Chol/HDL Ratio: 4.9 ratio — ABNORMAL HIGH (ref 0.0–4.4)
Cholesterol, Total: 216 mg/dL — ABNORMAL HIGH (ref 100–199)
HDL: 44 mg/dL (ref >39)
LDL Chol Calc (NIH): 152 mg/dL — ABNORMAL HIGH (ref 0–99)
Triglycerides: 112 mg/dL (ref 0–149)
VLDL Cholesterol Cal: 20 mg/dL (ref 5–40)

## 2020-06-02 LAB — THYROID PANEL WITH TSH
Free Thyroxine Index: 2.1 (ref 1.2–4.9)
T3 Uptake Ratio: 22 % — ABNORMAL LOW (ref 24–39)
T4, Total: 9.4 ug/dL (ref 4.5–12.0)
TSH: 1.51 u[IU]/mL (ref 0.450–4.500)

## 2020-06-20 ENCOUNTER — Other Ambulatory Visit: Payer: Self-pay | Admitting: Family Medicine

## 2020-11-29 ENCOUNTER — Encounter: Payer: Self-pay | Admitting: Family Medicine

## 2020-11-29 ENCOUNTER — Other Ambulatory Visit: Payer: Self-pay

## 2020-11-29 ENCOUNTER — Ambulatory Visit (INDEPENDENT_AMBULATORY_CARE_PROVIDER_SITE_OTHER): Payer: Medicare Other | Admitting: Family Medicine

## 2020-11-29 VITALS — BP 133/95 | HR 114 | Temp 98.2°F | Ht 61.0 in | Wt 132.4 lb

## 2020-11-29 DIAGNOSIS — F988 Other specified behavioral and emotional disorders with onset usually occurring in childhood and adolescence: Secondary | ICD-10-CM

## 2020-11-29 DIAGNOSIS — F419 Anxiety disorder, unspecified: Secondary | ICD-10-CM | POA: Diagnosis not present

## 2020-11-29 DIAGNOSIS — Z79899 Other long term (current) drug therapy: Secondary | ICD-10-CM

## 2020-11-29 MED ORDER — LORAZEPAM 0.5 MG PO TABS: 0.5000 mg | ORAL_TABLET | Freq: Two times a day (BID) | ORAL | 2 refills | Status: DC | PRN

## 2020-11-29 NOTE — Progress Notes (Signed)
Assessment & Plan:  1. Anxiety Well controlled on current regimen.  - LORazepam (ATIVAN) 0.5 MG tablet; Take 1 tablet (0.5 mg total) by mouth 2 (two) times daily as needed for anxiety.  Dispense: 15 tablet; Refill: 2 - ToxASSURE Select 13 (MW), Urine  2. Controlled substance agreement signed Controlled substance agreement in place and updated today. Urine drug screen collected today. PDMP reviewed with no concerning findings. - LORazepam (ATIVAN) 0.5 MG tablet; Take 1 tablet (0.5 mg total) by mouth 2 (two) times daily as needed for anxiety.  Dispense: 15 tablet; Refill: 2 - ToxASSURE Select 13 (MW), Urine  3. Attention deficit disorder, unspecified hyperactivity presence Well controlled on current regimen.    Return in about 6 months (around 06/01/2021) for annual physical.  Hendricks Limes, MSN, APRN, FNP-C [facility]  Subjective:    Patient ID: Kayla Shepherd, female    DOB: 11-Nov-1980, 40 y.o.   MRN: [id number]  Patient Care Team: Loman Brooklyn, FNP as PCP - General (Family Medicine)   Chief Complaint:  Chief Complaint  Patient presents with   ADHD   Anxiety    6 month follow up of chronic medical conditions     HPI: Kayla Shepherd is a 40 y.o. female presenting on 11/29/2020 for ADHD and Anxiety (6 month follow up of chronic medical conditions )  Patient is accompanied by her mother who she is okay with being present.  Anxiety: Doing well with Lexapro 10 mg once daily.  She has a prescription for Ativan that she takes as needed, which is not very often.  Her last prescription of 15 tablets was filled 8 months ago.  GAD 7 : Generalized Anxiety Score 11/29/2020 06/01/2020 11/30/2019 10/19/2019  Nervous, Anxious, on Edge 0 0 0 3  Control/stop worrying 0 0 0 3  Worry too much - different things 0 0 0 3  Trouble relaxing 0 0 0 1  Restless 0 0 0 1  Easily annoyed or irritable 0 0 0 2  Afraid - awful might happen 0 0 0 2  Total GAD 7 Score 0 0 0  15  Anxiety Difficulty Not difficult at all - - Very difficult    ADHD: doing well with Strattera.  New complaints: None  Social history:  Relevant past medical, surgical, family and social history reviewed and updated as indicated. Interim medical history since our last visit reviewed.  Allergies and medications reviewed and updated.  DATA REVIEWED: CHART IN EPIC  ROS: Negative unless specifically indicated above in HPI.    Current Outpatient Medications:    atomoxetine (STRATTERA) 40 MG capsule, Take 1 capsule (40 mg total) by mouth daily., Disp: 90 capsule, Rfl: 3   buPROPion (WELLBUTRIN XL) 300 MG 24 hr tablet, Take 1 tablet (300 mg total) by mouth daily., Disp: 90 tablet, Rfl: 3   escitalopram (LEXAPRO) 10 MG tablet, Take 1 tablet (10 mg total) by mouth daily., Disp: 90 tablet, Rfl: 3   LORazepam (ATIVAN) 0.5 MG tablet, Take 1 tablet (0.5 mg total) by mouth 2 (two) times daily as needed for anxiety., Disp: 15 tablet, Rfl: 2   No Known Allergies Past Medical History:  Diagnosis Date   ADD (attention deficit disorder)    Depression     History reviewed. No pertinent surgical history.  Social History   Socioeconomic History   Marital status: Single    Spouse name: Not on file   Number of children: Not on file  Years of education: Not on file   Highest education level: Not on file  Occupational History   Not on file  Tobacco Use   Smoking status: Never   Smokeless tobacco: Never  Vaping Use   Vaping Use: Never used  Substance and Sexual Activity   Alcohol use: No   Drug use: No   Sexual activity: Not on file  Other Topics Concern   Not on file  Social History Narrative   Not on file   Social Determinants of Health   Financial Resource Strain: Not on file  Food Insecurity: Not on file  Transportation Needs: Not on file  Physical Activity: Not on file  Stress: Not on file  Social Connections: Not on file  Intimate Partner Violence: Not on file         Objective:    BP (!) 133/95   Pulse (!) 114   Temp 98.2 F (36.8 C) (Temporal)   Ht 5\' 1"  (1.549 m)   Wt 132 lb 6.4 oz (60.1 kg)   SpO2 97%   BMI 25.02 kg/m   Wt Readings from Last 3 Encounters:  11/29/20 132 lb 6.4 oz (60.1 kg)  06/01/20 132 lb 9.6 oz (60.1 kg)  11/30/19 140 lb 6.4 oz (63.7 kg)    Physical Exam Vitals reviewed.  Constitutional:      General: She is not in acute distress.    Appearance: Normal appearance. She is normal weight. She is not ill-appearing, toxic-appearing or diaphoretic.  HENT:     Head: Normocephalic and atraumatic.  Eyes:     General: No scleral icterus.       Right eye: No discharge.        Left eye: No discharge.     Conjunctiva/sclera: Conjunctivae normal.  Cardiovascular:     Rate and Rhythm: Normal rate and regular rhythm.     Heart sounds: Normal heart sounds. No murmur heard.   No friction rub. No gallop.  Pulmonary:     Effort: Pulmonary effort is normal. No respiratory distress.     Breath sounds: Normal breath sounds. No stridor. No wheezing, rhonchi or rales.  Musculoskeletal:        General: Normal range of motion.     Cervical back: Normal range of motion.  Skin:    General: Skin is warm and dry.     Capillary Refill: Capillary refill takes less than 2 seconds.  Neurological:     General: No focal deficit present.     Mental Status: She is alert and oriented to person, place, and time. Mental status is at baseline.  Psychiatric:        Mood and Affect: Mood normal.        Behavior: Behavior normal.        Thought Content: Thought content normal.        Judgment: Judgment normal.    Lab Results  Component Value Date   TSH 1.510 06/01/2020   Lab Results  Component Value Date   WBC 8.9 06/01/2020   HGB 13.6 06/01/2020   HCT 39.0 06/01/2020   MCV 91 06/01/2020   PLT 445 06/01/2020   Lab Results  Component Value Date   NA 142 06/01/2020   K 3.7 06/01/2020   CO2 26 06/01/2020   GLUCOSE 102 (H) 06/01/2020    BUN 7 06/01/2020   CREATININE 0.94 06/01/2020   BILITOT 0.4 06/01/2020   ALKPHOS 80 06/01/2020   AST 28 06/01/2020   ALT 27  06/01/2020   PROT 7.4 06/01/2020   ALBUMIN 4.7 06/01/2020   CALCIUM 9.4 06/01/2020   Lab Results  Component Value Date   CHOL 216 (H) 06/01/2020   Lab Results  Component Value Date   HDL 44 06/01/2020   Lab Results  Component Value Date   LDLCALC 152 (H) 06/01/2020   Lab Results  Component Value Date   TRIG 112 06/01/2020   Lab Results  Component Value Date   CHOLHDL 4.9 (H) 06/01/2020   No results found for: HGBA1C

## 2020-11-30 ENCOUNTER — Telehealth: Payer: Self-pay | Admitting: *Deleted

## 2020-11-30 DIAGNOSIS — F419 Anxiety disorder, unspecified: Secondary | ICD-10-CM

## 2020-11-30 NOTE — Telephone Encounter (Signed)
PA started for LORazepam 0.'5MG'$  tablets Key: [id number] Sent to plan   Approved today Effective from 11/30/2020 through 11/30/2021  [facility] aware

## 2020-12-03 LAB — TOXASSURE SELECT 13 (MW), URINE

## 2021-02-04 ENCOUNTER — Ambulatory Visit (INDEPENDENT_AMBULATORY_CARE_PROVIDER_SITE_OTHER): Payer: Medicare Other

## 2021-02-04 VITALS — Ht 61.0 in | Wt 130.0 lb

## 2021-02-04 DIAGNOSIS — Z0000 Encounter for general adult medical examination without abnormal findings: Secondary | ICD-10-CM | POA: Diagnosis not present

## 2021-02-04 NOTE — Patient Instructions (Signed)
Kayla Shepherd , Thank you for taking time to come for your Medicare Wellness Visit. I appreciate your ongoing commitment to your health goals. Please review the following plan we discussed and let me know if I can assist you in the future.   Screening recommendations/referrals: Colonoscopy: Due at age 38 Mammogram: Due at age 59 Bone Density: Due at age 54 Recommended yearly ophthalmology/optometry visit for glaucoma screening and checkup Recommended yearly dental visit for hygiene and checkup  Vaccinations: Influenza vaccine: Done 06/01/2020 - repeat every fall *due now Pneumococcal vaccine: Due after age 88 Tdap vaccine: Due. Every 10 years Shingles vaccine: Due at age 26  Covid-19: Done 08/16/2019, 09/13/2019, & 05/22/2020  Advanced directives: Please bring an updated copy of your health care power of attorney and living will to the office to be added to your chart at your convenience. The last one we have is from 2011 and it is recommended to update these every 10 years.  Conditions/risks identified: Please bring a copy of your health care power of attorney and living will to the office to be added to your chart at your convenience.   Next appointment: Follow up in one year for your annual wellness visit.   Preventive Care 35-64 Years, Female Preventive care refers to lifestyle choices and visits with your health care provider that can promote health and wellness. What does preventive care include? A yearly physical exam. This is also called an annual well check. Dental exams once or twice a year. Routine eye exams. Ask your health care provider how often you should have your eyes checked. Personal lifestyle choices, including: Daily care of your teeth and gums. Regular physical activity. Eating a healthy diet. Avoiding tobacco and drug use. Limiting alcohol use. Practicing safe sex. Taking low-dose aspirin daily starting at age 58. Taking vitamin and mineral supplements as  recommended by your health care provider. What happens during an annual well check? The services and screenings done by your health care provider during your annual well check will depend on your age, overall health, lifestyle risk factors, and family history of disease. Counseling  Your health care provider may ask you questions about your: Alcohol use. Tobacco use. Drug use. Emotional well-being. Home and relationship well-being. Sexual activity. Eating habits. Work and work Statistician. Method of birth control. Menstrual cycle. Pregnancy history. Screening  You may have the following tests or measurements: Height, weight, and BMI. Blood pressure. Lipid and cholesterol levels. These may be checked every 5 years, or more frequently if you are over 74 years old. Skin check. Lung cancer screening. You may have this screening every year starting at age 56 if you have a 30-pack-year history of smoking and currently smoke or have quit within the past 15 years. Fecal occult blood test (FOBT) of the stool. You may have this test every year starting at age 67. Flexible sigmoidoscopy or colonoscopy. You may have a sigmoidoscopy every 5 years or a colonoscopy every 10 years starting at age 41. Hepatitis C blood test. Hepatitis B blood test. Sexually transmitted disease (STD) testing. Diabetes screening. This is done by checking your blood sugar (glucose) after you have not eaten for a while (fasting). You may have this done every 1-3 years. Mammogram. This may be done every 1-2 years. Talk to your health care provider about when you should start having regular mammograms. This may depend on whether you have a family history of breast cancer. BRCA-related cancer screening. This may be done if you have  a family history of breast, ovarian, tubal, or peritoneal cancers. Pelvic exam and Pap test. This may be done every 3 years starting at age 27. Starting at age 53, this may be done every 5 years if  you have a Pap test in combination with an HPV test. Bone density scan. This is done to screen for osteoporosis. You may have this scan if you are at high risk for osteoporosis. Discuss your test results, treatment options, and if necessary, the need for more tests with your health care provider. Vaccines  Your health care provider may recommend certain vaccines, such as: Influenza vaccine. This is recommended every year. Tetanus, diphtheria, and acellular pertussis (Tdap, Td) vaccine. You may need a Td booster every 10 years. Zoster vaccine. You may need this after age 76. Pneumococcal 13-valent conjugate (PCV13) vaccine. You may need this if you have certain conditions and were not previously vaccinated. Pneumococcal polysaccharide (PPSV23) vaccine. You may need one or two doses if you smoke cigarettes or if you have certain conditions. Talk to your health care provider about which screenings and vaccines you need and how often you need them. This information is not intended to replace advice given to you by your health care provider. Make sure you discuss any questions you have with your health care provider. Document Released: 05/25/2015 Document Revised: 01/16/2016 Document Reviewed: 02/27/2015 Elsevier Interactive Patient Education  2017 [employer].

## 2021-02-04 NOTE — Progress Notes (Signed)
Subjective:   Kayla Shepherd is a 40 y.o. female who presents for an Initial Medicare Annual Wellness Visit.  Virtual Visit via Telephone Note  I connected with  Barbaraann Cao on 02/04/21 at  9:45 AM EDT by telephone and verified that I am speaking with the correct person using two identifiers.  Location: Patient: Home Provider: [facility] Persons participating in the virtual visit: patient/mother, [place]   I discussed the limitations, risks, security and privacy concerns of performing an evaluation and management service by telephone and the availability of in person appointments. The patient expressed understanding and agreed to proceed.  Interactive audio and video telecommunications were attempted between this nurse and patient, however failed, due to patient having technical difficulties OR patient did not have access to video capability.  We continued and completed visit with audio only.  Some vital signs may be absent or patient reported.   Sakai Wolford E Traeton Bordas, LPN   Review of Systems     Cardiac Risk Factors include: dyslipidemia;sedentary lifestyle     Objective:    Today's Vitals   02/04/21 0948  Weight: 130 lb (59 kg)  Height: 5\' 1"  (1.549 m)   Body mass index is 24.56 kg/m.  No flowsheet data found.  Current Medications (verified) Outpatient Encounter Medications as of 02/04/2021  Medication Sig   atomoxetine (STRATTERA) 40 MG capsule Take 1 capsule (40 mg total) by mouth daily.   buPROPion (WELLBUTRIN XL) 300 MG 24 hr tablet Take 1 tablet (300 mg total) by mouth daily.   escitalopram (LEXAPRO) 10 MG tablet Take 1 tablet (10 mg total) by mouth daily.   LORazepam (ATIVAN) 0.5 MG tablet Take 1 tablet (0.5 mg total) by mouth 2 (two) times daily as needed for anxiety.   No facility-administered encounter medications on file as of 02/04/2021.    Allergies (verified) Patient has no known allergies.   History: Past Medical History:  Diagnosis  Date   ADD (attention deficit disorder)    Depression    History reviewed. No pertinent surgical history. Family History  Problem Relation Age of Onset   Hyperlipidemia Father    Social History   Socioeconomic History   Marital status: Single    Spouse name: Not on file   Number of children: 0   Years of education: Not on file   Highest education level: Not on file  Occupational History   Occupation: disability  Tobacco Use   Smoking status: Never   Smokeless tobacco: Never  Vaping Use   Vaping Use: Never used  Substance and Sexual Activity   Alcohol use: No   Drug use: No   Sexual activity: Not on file  Other Topics Concern   Not on file  Social History Narrative   Lives with her parents   Social Determinants of Health   Financial Resource Strain: Low Risk    Difficulty of Paying Living Expenses: Not hard at all  Food Insecurity: No Food Insecurity   Worried About Charity fundraiser in the Last Year: Never true   Aline in the Last Year: Never true  Transportation Needs: No Transportation Needs   Lack of Transportation (Medical): No   Lack of Transportation (Non-Medical): No  Physical Activity: Inactive   Days of Exercise per Week: 0 days   Minutes of Exercise per Session: 0 min  Stress: Stress Concern Present   Feeling of Stress : To some extent  Social Connections: Moderately Isolated  Frequency of Communication with Friends and Family: More than three times a week   Frequency of Social Gatherings with Friends and Family: More than three times a week   Attends Religious Services: More than 4 times per year   Active Member of [employer] or Organizations: No   Attends Music therapist: Never   Marital Status: Never married    Tobacco Counseling Counseling given: Not Answered   Clinical Intake:  Pre-visit preparation completed: Yes  Pain : No/denies pain     BMI - recorded: 24.56 Nutritional Status: BMI of 19-24   Normal Nutritional Risks: None Diabetes: No  How often do you need to have someone help you when you read instructions, pamphlets, or other written materials from your doctor or pharmacy?: 1 - Never  Diabetic? No  Interpreter Needed?: No  Information entered by :: Geneen Dieter, LPN   Activities of Daily Living In your present state of health, do you have any difficulty performing the following activities: 02/04/2021  Hearing? N  Vision? N  Difficulty concentrating or making decisions? Y  Walking or climbing stairs? N  Dressing or bathing? N  Doing errands, shopping? Y  Comment doesn't drive - parents drive her  Preparing Food and eating ? N  Using the Toilet? N  In the past six months, have you accidently leaked urine? N  Do you have problems with loss of bowel control? N  Managing your Medications? N  Managing your Finances? Y  Comment mom handles  Housekeeping or managing your Housekeeping? N  Some recent data might be hidden    Patient Care Team: Loman Brooklyn, FNP as PCP - General (Family Medicine)  Indicate any recent Medical Services you may have received from other than [facility] providers in the past year (date may be approximate).     Assessment:   This is a routine wellness examination for [place].  Hearing/Vision screen Hearing Screening - Comments:: Denies hearing difficulties Vision Screening - Comments:: Wears rx glasses - up to date with semi-annual eye exams with [facility]  Dietary issues and exercise activities discussed: Current Exercise Habits: Home exercise routine, Type of exercise: Other - see comments (swimming almost daily during summer), Time (Minutes): 60, Frequency (Times/Week): 3, Weekly Exercise (Minutes/Week): 180, Intensity: Mild, Exercise limited by: psychological condition(s)   Goals Addressed             This Visit's Progress    Exercise 3x per week (30 min per time)         Depression Screen PHQ 2/9 Scores 02/04/2021  11/29/2020 06/01/2020 11/30/2019 10/19/2019 09/26/2019 07/23/2018  PHQ - 2 Score 0 0 0 1 1 0 0  PHQ- 9 Score - 0 0 1 8 3  -    Fall Risk Fall Risk  02/04/2021 10/19/2019 09/20/2019 12/30/2017 08/27/2017  Falls in the past year? 0 0 0 No No  Number falls in past yr: 0 - - - -  Injury with Fall? 0 - - - -  Risk for fall due to : No Fall Risks - - - -  Follow up Falls prevention discussed - - - -    FALL RISK PREVENTION PERTAINING TO THE HOME:  Any stairs in or around the home? Yes  If so, are there any without handrails? No  Home free of loose throw rugs in walkways, pet beds, electrical cords, etc? Yes  Adequate lighting in your home to reduce risk of falls? Yes   ASSISTIVE DEVICES UTILIZED TO PREVENT  FALLS:  Life alert? No  Use of a cane, walker or w/c? No  Grab bars in the bathroom? No  Shower chair or bench in shower? No  Elevated toilet seat or a handicapped toilet? No   TIMED UP AND GO:  Was the test performed? No . Telephonic visit.  Cognitive Function: Normal cognitive status assessed by direct observation by this Nurse Health Advisor. No abnormalities found.         Immunizations Immunization History  Administered Date(s) Administered   Influenza,inj,Quad PF,6+ Mos 03/26/2016, 03/18/2017, 03/17/2018, 03/23/2019, 06/01/2020   Moderna Sars-Covid-2 Vaccination 08/16/2019, 09/13/2019, 05/22/2020    TDAP status: Due, Education has been provided regarding the importance of this vaccine. Advised may receive this vaccine at local pharmacy or Health Dept. Aware to provide a copy of the vaccination record if obtained from local pharmacy or Health Dept. Verbalized acceptance and understanding.  Flu Vaccine status: Due, Education has been provided regarding the importance of this vaccine. Advised may receive this vaccine at local pharmacy or Health Dept. Aware to provide a copy of the vaccination record if obtained from local pharmacy or Health Dept. Verbalized acceptance and  understanding.  Covid-19 vaccine status: Completed vaccines  Qualifies for Shingles Vaccine? No     Screening Tests Health Maintenance  Topic Date Due   Hepatitis C Screening  Never done   PAP SMEAR-Modifier  Never done   COVID-19 Vaccine (4 - Booster for Moderna series) 08/14/2020   INFLUENZA VACCINE  12/10/2020   TETANUS/TDAP  11/29/2021 (Originally 07/15/2001)   HIV Screening  11/29/2021 (Originally 07/15/1997)   HPV VACCINES  Aged Out    Health Maintenance  Health Maintenance Due  Topic Date Due   Hepatitis C Screening  Never done   PAP SMEAR-Modifier  Never done   COVID-19 Vaccine (4 - Booster for Moderna series) 08/14/2020   INFLUENZA VACCINE  12/10/2020    Lung Cancer Screening: (Low Dose CT Chest recommended if Age 20-80 years, 30 pack-year currently smoking OR have quit w/in 15years.) does not qualify.   Additional Screening:  Hepatitis C Screening: does not qualify  Vision Screening: Recommended annual ophthalmology exams for early detection of glaucoma and other disorders of the eye. Is the patient up to date with their annual eye exam?  Yes  Who is the provider or what is the name of the office in which the patient attends annual eye exams? [facility] If pt is not established with a provider, would they like to be referred to a provider to establish care? No .   Dental Screening: Recommended annual dental exams for proper oral hygiene  Community Resource Referral / Chronic Care Management: CRR required this visit?  No   CCM required this visit?  No      Plan:     I have personally reviewed and noted the following in the patient's chart:   Medical and social history Use of alcohol, tobacco or illicit drugs  Current medications and supplements including opioid prescriptions. Patient is not currently taking opioid prescriptions. Functional ability and status Nutritional status Physical activity Advanced directives List of other physicians Hospitalizations,  surgeries, and ER visits in previous 12 months Vitals Screenings to include cognitive, depression, and falls Referrals and appointments  In addition, I have reviewed and discussed with patient certain preventive protocols, quality metrics, and best practice recommendations. A written personalized care plan for preventive services as well as general preventive health recommendations were provided to patient.     Alvetta Hidrogo E Daliana Leverett,  LPN   [id number]   Nurse Notes: None

## 2021-03-15 ENCOUNTER — Ambulatory Visit (INDEPENDENT_AMBULATORY_CARE_PROVIDER_SITE_OTHER): Payer: Medicare Other

## 2021-03-15 ENCOUNTER — Other Ambulatory Visit: Payer: Self-pay

## 2021-03-15 DIAGNOSIS — Z23 Encounter for immunization: Secondary | ICD-10-CM

## 2021-06-04 ENCOUNTER — Ambulatory Visit (INDEPENDENT_AMBULATORY_CARE_PROVIDER_SITE_OTHER): Payer: Medicare Other | Admitting: Family Medicine

## 2021-06-04 ENCOUNTER — Encounter: Payer: Self-pay | Admitting: Family Medicine

## 2021-06-04 VITALS — BP 148/97 | HR 116 | Temp 98.4°F | Ht 61.0 in | Wt 135.2 lb

## 2021-06-04 DIAGNOSIS — F988 Other specified behavioral and emotional disorders with onset usually occurring in childhood and adolescence: Secondary | ICD-10-CM | POA: Diagnosis not present

## 2021-06-04 DIAGNOSIS — E782 Mixed hyperlipidemia: Secondary | ICD-10-CM

## 2021-06-04 DIAGNOSIS — Z0001 Encounter for general adult medical examination with abnormal findings: Secondary | ICD-10-CM | POA: Diagnosis not present

## 2021-06-04 DIAGNOSIS — F419 Anxiety disorder, unspecified: Secondary | ICD-10-CM

## 2021-06-04 DIAGNOSIS — Z114 Encounter for screening for human immunodeficiency virus [HIV]: Secondary | ICD-10-CM

## 2021-06-04 DIAGNOSIS — Z1159 Encounter for screening for other viral diseases: Secondary | ICD-10-CM

## 2021-06-04 DIAGNOSIS — Z0000 Encounter for general adult medical examination without abnormal findings: Secondary | ICD-10-CM | POA: Diagnosis not present

## 2021-06-04 DIAGNOSIS — F3342 Major depressive disorder, recurrent, in full remission: Secondary | ICD-10-CM

## 2021-06-04 DIAGNOSIS — D229 Melanocytic nevi, unspecified: Secondary | ICD-10-CM

## 2021-06-04 DIAGNOSIS — Z79899 Other long term (current) drug therapy: Secondary | ICD-10-CM

## 2021-06-04 MED ORDER — LORAZEPAM 0.5 MG PO TABS: 0.5000 mg | ORAL_TABLET | Freq: Two times a day (BID) | ORAL | 2 refills | Status: DC | PRN

## 2021-06-04 NOTE — Progress Notes (Signed)
Assessment & Plan:  1. Well adult exam - printed wellness information provided - CBC with Differential/Platelet - CMP14+EGFR - Lipid panel  2. Mixed hyperlipidemia - labs to assess - Lipid panel  3. Attention deficit disorder, unspecified hyperactivity presence Well controlled on current regimen with Strattera - CMP14+EGFR  4-6. Recurrent major depressive disorder, in full remission (HCC)/Anxiety/Controlled substance agreement signed - Well controlled on current regimen with Lexapro and Ativan as needed. Controlled substance agreement in place. Urine drug screen as expected. PDMP reviewed with no concerning findings.  - LORazepam (ATIVAN) 0.5 MG tablet; Take 1 tablet (0.5 mg total) by mouth 2 (two) times daily as needed for anxiety.  Dispense: 15 tablet; Refill: 2 - CMP14+EGFR  7. Numerous moles - Ambulatory referral to Dermatology  8. Encounter for hepatitis C screening test for low risk patient - Hepatitis C antibody (reflex, frozen specimen)  9. Encounter for screening for HIV - HIV antibody (with reflex)   Follow-up: Return in about 6 months (around 12/02/2021) for follow-up of chronic medication conditions.   Kayla Sabina, NP Student   I personally was present during the history, physical exam, and medical decision-making activities of this service and have verified that the service and findings are accurately documented in the nurse practitioner student's note.  Deliah Boston, MSN, APRN, FNP-C [facility]  Subjective:  Patient ID: Kayla Shepherd, female    DOB: 1981-01-09  Age: 41 y.o. MRN: [id number]  Patient Care Team: Gwenlyn Fudge, FNP as PCP - General (Family Medicine)   CC:  Chief Complaint  Patient presents with   Gynecologic Exam    HPI Kayla Shepherd presents for her annual physical. She is accompanied by her mom, who she is okay with being present.   Occupation: none, Marital status: single, Substance use: none Diet:  eats healthy, small portions Last pap smear: never, planned today Dentist: Dr. Karlene Einstein on [address], [place] Exams: Dr. Charlton Haws, [place], every 2 years, due soon Hepatitis C Screening: agreeable to complete today Immunizations: Flu Vaccine:  up to date Tdap Vaccine: declined  COVID-19 Vaccine: up to date  Anxiety: doing well with Lexapro 10 mg daily. She has a prescription for Ativan that she very rarely takes as needed. Her last prescription was filled 11/30/20. She has a controlled substance agreement signed on the same date. She has not refilled her prescription for Ativan since she does not use it often.   Depression screen [facility] 2/9 06/04/2021 02/04/2021 11/29/2020  Decreased Interest 0 0 0  Down, Depressed, Hopeless 0 0 0  PHQ - 2 Score 0 0 0  Altered sleeping 0 - 0  Tired, decreased energy 0 - 0  Change in appetite 0 - 0  Feeling bad or failure about yourself  0 - 0  Trouble concentrating 0 - 0  Moving slowly or fidgety/restless 0 - 0  Suicidal thoughts 0 - 0  PHQ-9 Score 0 - 0  Difficult doing work/chores Not difficult at all - Not difficult at all  Some recent data might be hidden   GAD 7 : Generalized Anxiety Score 06/04/2021 11/29/2020 06/01/2020 11/30/2019  Nervous, Anxious, on [place] 0 0 0 0  Control/stop worrying 0 0 0 0  Worry too much - different things 0 0 0 0  Trouble relaxing 0 0 0 0  Restless 0 0 0 0  Easily annoyed or irritable 0 0 0 0  Afraid - awful might happen 0 0 0 0  Total GAD 7 Score 0 0 0 0  Anxiety Difficulty Not difficult at all Not difficult at all - -   ADHD: controlled with Strattera.  New complaints: She is concerned about freckles and moles on her back. Her dad was recently diagnosed with melanoma so she is concerned about her risk of developing the same. She would like a dermatology referral for a provider she saw several years ago, Amy [place] at [facility].   Her blood pressure is elevated today but she reports  feelings of anxiety related to her pap smear today. She states she believes it is related to this. She does not check her BP at home.    Review of Systems  Constitutional:  Negative for chills, fever, malaise/fatigue and weight loss.  HENT:  Negative for congestion, ear discharge, ear pain, nosebleeds, sinus pain, sore throat and tinnitus.   Eyes:  Negative for blurred vision, double vision, pain, discharge and redness.  Respiratory:  Negative for cough, shortness of breath and wheezing.   Cardiovascular:  Negative for chest pain, palpitations and leg swelling.  Gastrointestinal:  Negative for abdominal pain, constipation, diarrhea, heartburn, nausea and vomiting.  Genitourinary:  Negative for dysuria, frequency and urgency.  Musculoskeletal:  Negative for myalgias.  Skin:  Negative for rash.  Neurological:  Negative for dizziness, seizures, weakness and headaches.  Psychiatric/Behavioral:  Negative for depression, substance abuse and suicidal ideas. The patient is not nervous/anxious.     Current Outpatient Medications:    atomoxetine (STRATTERA) 40 MG capsule, Take 1 capsule (40 mg total) by mouth daily., Disp: 90 capsule, Rfl: 3   buPROPion (WELLBUTRIN XL) 300 MG 24 hr tablet, Take 1 tablet (300 mg total) by mouth daily., Disp: 90 tablet, Rfl: 3   escitalopram (LEXAPRO) 10 MG tablet, Take 1 tablet (10 mg total) by mouth daily., Disp: 90 tablet, Rfl: 3   LORazepam (ATIVAN) 0.5 MG tablet, Take 1 tablet (0.5 mg total) by mouth 2 (two) times daily as needed for anxiety., Disp: 15 tablet, Rfl: 2  No Known Allergies  Past Medical History:  Diagnosis Date   ADD (attention deficit disorder)    Depression     History reviewed. No pertinent surgical history.  Family History  Problem Relation Age of Onset   Hyperlipidemia Father     Social History   Socioeconomic History   Marital status: Single    Spouse name: Not on file   Number of children: 0   Years of education: Not on file    Highest education level: Not on file  Occupational History   Occupation: disability  Tobacco Use   Smoking status: Never   Smokeless tobacco: Never  Vaping Use   Vaping Use: Never used  Substance and Sexual Activity   Alcohol use: No   Drug use: No   Sexual activity: Not on file  Other Topics Concern   Not on file  Social History Narrative   Lives with her parents   Social Determinants of Health   Financial Resource Strain: Low Risk    Difficulty of Paying Living Expenses: Not hard at all  Food Insecurity: No Food Insecurity   Worried About Programme researcher, broadcasting/film/video in the Last Year: Never true   Ran Out of Food in the Last Year: Never true  Transportation Needs: No Transportation Needs   Lack of Transportation (Medical): No   Lack of Transportation (Non-Medical): No  Physical Activity: Inactive   Days of Exercise per Week: 0 days  Minutes of Exercise per Session: 0 min  Stress: Stress Concern Present   Feeling of Stress : To some extent  Social Connections: Moderately Isolated   Frequency of Communication with Friends and Family: More than three times a week   Frequency of Social Gatherings with Friends and Family: More than three times a week   Attends Religious Services: More than 4 times per year   Active Member of [employer] or Organizations: No   Attends Archivist Meetings: Never   Marital Status: Never married  Human resources officer Violence: Not At Risk   Fear of Current or Ex-Partner: No   Emotionally Abused: No   Physically Abused: No   Sexually Abused: No      Objective:    BP (!) 148/97    Pulse (!) 116    Temp 98.4 F (36.9 C) (Temporal)    Ht $R'5\' 1"'Ua$  (1.549 m)    Wt 61.3 kg    SpO2 100%    BMI 25.55 kg/m   Wt Readings from Last 3 Encounters:  06/04/21 135 lb 3.2 oz (61.3 kg)  02/04/21 130 lb (59 kg)  11/29/20 132 lb 6.4 oz (60.1 kg)    Physical Exam Vitals reviewed. Exam conducted with a chaperone present.  Constitutional:      General: She is  not in acute distress.    Appearance: Normal appearance. She is normal weight. She is not ill-appearing, toxic-appearing or diaphoretic.  HENT:     Head: Normocephalic and atraumatic.     Right Ear: Tympanic membrane, ear canal and external ear normal. There is no impacted cerumen.     Left Ear: Tympanic membrane, ear canal and external ear normal. There is no impacted cerumen.     Nose: Nose normal. No congestion or rhinorrhea.     Mouth/Throat:     Mouth: Mucous membranes are moist.     Pharynx: Oropharynx is clear. No oropharyngeal exudate or posterior oropharyngeal erythema.  Eyes:     General: No scleral icterus.       Right eye: No discharge.        Left eye: No discharge.     Conjunctiva/sclera: Conjunctivae normal.     Pupils: Pupils are equal, round, and reactive to light.  Cardiovascular:     Rate and Rhythm: Normal rate and regular rhythm.     Heart sounds: Normal heart sounds. No murmur heard.   No friction rub. No gallop.  Pulmonary:     Effort: Pulmonary effort is normal. No respiratory distress.     Breath sounds: Normal breath sounds. No stridor. No wheezing, rhonchi or rales.  Chest:     Chest wall: No mass, lacerations, deformity, swelling, tenderness, crepitus or edema.  Breasts:    Breasts are symmetrical.     Right: Normal.     Left: Normal.  Abdominal:     General: Abdomen is flat. Bowel sounds are normal. There is no distension.     Palpations: Abdomen is soft. There is no mass.     Tenderness: There is no abdominal tenderness. There is no guarding or rebound.     Hernia: No hernia is present.  Genitourinary:    General: Normal vulva.     Exam position: Lithotomy position.     Labia:        Right: No rash, tenderness, lesion or injury.        Left: No rash, tenderness, lesion or injury.      Comments: Unable to complete  internal exam due to patient anxiety and inability to introduce speculum into vagina due to tension.  Musculoskeletal:        General:  Normal range of motion.     Cervical back: Normal range of motion and neck supple. No rigidity. No muscular tenderness.  Lymphadenopathy:     Cervical: No cervical adenopathy.     Upper Body:     Right upper body: No supraclavicular, axillary or pectoral adenopathy.     Left upper body: No supraclavicular, axillary or pectoral adenopathy.  Skin:    General: Skin is warm and dry.     Capillary Refill: Capillary refill takes less than 2 seconds.     Comments: Small seborrhoic keratoses and skin tags on torso.   Neurological:     General: No focal deficit present.     Mental Status: She is alert and oriented to person, place, and time. Mental status is at baseline.  Psychiatric:        Mood and Affect: Mood normal.        Behavior: Behavior normal.        Thought Content: Thought content normal.        Judgment: Judgment normal.    Lab Results  Component Value Date   TSH 1.510 06/01/2020   Lab Results  Component Value Date   WBC 8.9 06/01/2020   HGB 13.6 06/01/2020   HCT 39.0 06/01/2020   MCV 91 06/01/2020   PLT 445 06/01/2020   Lab Results  Component Value Date   NA 142 06/01/2020   K 3.7 06/01/2020   CO2 26 06/01/2020   GLUCOSE 102 (H) 06/01/2020   BUN 7 06/01/2020   CREATININE 0.94 06/01/2020   BILITOT 0.4 06/01/2020   ALKPHOS 80 06/01/2020   AST 28 06/01/2020   ALT 27 06/01/2020   PROT 7.4 06/01/2020   ALBUMIN 4.7 06/01/2020   CALCIUM 9.4 06/01/2020   Lab Results  Component Value Date   CHOL 216 (H) 06/01/2020   Lab Results  Component Value Date   HDL 44 06/01/2020   Lab Results  Component Value Date   LDLCALC 152 (H) 06/01/2020   Lab Results  Component Value Date   TRIG 112 06/01/2020   Lab Results  Component Value Date   CHOLHDL 4.9 (H) 06/01/2020   No results found for: HGBA1C

## 2021-06-05 LAB — CBC WITH DIFFERENTIAL/PLATELET
Basophils Absolute: 0.1 10*3/uL (ref 0.0–0.2)
Basos: 1 %
EOS (ABSOLUTE): 0.1 10*3/uL (ref 0.0–0.4)
Eos: 1 %
Hematocrit: 39.5 % (ref 34.0–46.6)
Hemoglobin: 13.1 g/dL (ref 11.1–15.9)
Immature Grans (Abs): 0 10*3/uL (ref 0.0–0.1)
Immature Granulocytes: 0 %
Lymphocytes Absolute: 1.8 10*3/uL (ref 0.7–3.1)
Lymphs: 23 %
MCH: 31.1 pg (ref 26.6–33.0)
MCHC: 33.2 g/dL (ref 31.5–35.7)
MCV: 94 fL (ref 79–97)
Monocytes Absolute: 0.6 10*3/uL (ref 0.1–0.9)
Monocytes: 8 %
Neutrophils Absolute: 5.5 10*3/uL (ref 1.4–7.0)
Neutrophils: 67 %
Platelets: 328 10*3/uL (ref 150–450)
RBC: 4.21 x10E6/uL (ref 3.77–5.28)
RDW: 12.1 % (ref 11.7–15.4)
WBC: 8.2 10*3/uL (ref 3.4–10.8)

## 2021-06-05 LAB — HCV INTERPRETATION

## 2021-06-05 LAB — CMP14+EGFR
ALT: 13 IU/L (ref 0–32)
AST: 17 IU/L (ref 0–40)
Albumin/Globulin Ratio: 1.9 (ref 1.2–2.2)
Albumin: 4.7 g/dL (ref 3.8–4.8)
Alkaline Phosphatase: 68 IU/L (ref 44–121)
BUN/Creatinine Ratio: 11 (ref 9–23)
BUN: 11 mg/dL (ref 6–20)
Bilirubin Total: 0.2 mg/dL (ref 0.0–1.2)
CO2: 24 mmol/L (ref 20–29)
Calcium: 9.4 mg/dL (ref 8.7–10.2)
Chloride: 104 mmol/L (ref 96–106)
Creatinine, Ser: 1.03 mg/dL — ABNORMAL HIGH (ref 0.57–1.00)
Globulin, Total: 2.5 g/dL (ref 1.5–4.5)
Glucose: 88 mg/dL (ref 70–99)
Potassium: 4.4 mmol/L (ref 3.5–5.2)
Sodium: 142 mmol/L (ref 134–144)
Total Protein: 7.2 g/dL (ref 6.0–8.5)
eGFR: 71 mL/min/{1.73_m2} (ref >59)

## 2021-06-05 LAB — LIPID PANEL
Chol/HDL Ratio: 4.4 ratio (ref 0.0–4.4)
Cholesterol, Total: 192 mg/dL (ref 100–199)
HDL: 44 mg/dL (ref >39)
LDL Chol Calc (NIH): 131 mg/dL — ABNORMAL HIGH (ref 0–99)
Triglycerides: 92 mg/dL (ref 0–149)
VLDL Cholesterol Cal: 17 mg/dL (ref 5–40)

## 2021-06-05 LAB — HIV ANTIBODY (ROUTINE TESTING W REFLEX): HIV Screen 4th Generation wRfx: NONREACTIVE

## 2021-06-05 LAB — HCV AB W REFLEX TO QUANT PCR: HCV Ab: <0.1 s/co ratio (ref 0.0–0.9)

## 2021-06-10 ENCOUNTER — Encounter: Payer: Self-pay | Admitting: Family Medicine

## 2021-06-10 ENCOUNTER — Telehealth: Payer: Self-pay | Admitting: Family Medicine

## 2021-06-14 ENCOUNTER — Other Ambulatory Visit: Payer: Self-pay | Admitting: Family Medicine

## 2021-06-14 DIAGNOSIS — F419 Anxiety disorder, unspecified: Secondary | ICD-10-CM

## 2021-06-14 DIAGNOSIS — Z79899 Other long term (current) drug therapy: Secondary | ICD-10-CM

## 2021-06-17 ENCOUNTER — Other Ambulatory Visit: Payer: Self-pay | Admitting: Family Medicine

## 2021-06-17 DIAGNOSIS — F419 Anxiety disorder, unspecified: Secondary | ICD-10-CM

## 2021-06-18 ENCOUNTER — Other Ambulatory Visit: Payer: Self-pay | Admitting: Family Medicine

## 2021-06-18 DIAGNOSIS — F988 Other specified behavioral and emotional disorders with onset usually occurring in childhood and adolescence: Secondary | ICD-10-CM

## 2021-07-09 ENCOUNTER — Other Ambulatory Visit: Payer: Self-pay | Admitting: Family Medicine

## 2021-07-09 DIAGNOSIS — F419 Anxiety disorder, unspecified: Secondary | ICD-10-CM

## 2021-09-29 ENCOUNTER — Emergency Department (HOSPITAL_COMMUNITY)
Admission: EM | Admit: 2021-09-29 | Discharge: 2021-09-29 | Disposition: A | Discharge status: Home / Self Care | Payer: Medicare Other | Attending: Emergency Medicine | Admitting: Emergency Medicine

## 2021-09-29 ENCOUNTER — Other Ambulatory Visit: Payer: Self-pay

## 2021-09-29 ENCOUNTER — Encounter (HOSPITAL_COMMUNITY): Payer: Self-pay

## 2021-09-29 ENCOUNTER — Emergency Department (HOSPITAL_COMMUNITY): Payer: Medicare Other

## 2021-09-29 DIAGNOSIS — R000 Tachycardia, unspecified: Secondary | ICD-10-CM | POA: Insufficient documentation

## 2021-09-29 DIAGNOSIS — Z20822 Contact with and (suspected) exposure to covid-19: Secondary | ICD-10-CM | POA: Diagnosis not present

## 2021-09-29 DIAGNOSIS — W19XXXA Unspecified fall, initial encounter: Secondary | ICD-10-CM | POA: Diagnosis not present

## 2021-09-29 DIAGNOSIS — R42 Dizziness and giddiness: Secondary | ICD-10-CM | POA: Diagnosis not present

## 2021-09-29 DIAGNOSIS — I10 Essential (primary) hypertension: Secondary | ICD-10-CM | POA: Diagnosis not present

## 2021-09-29 DIAGNOSIS — R55 Syncope and collapse: Secondary | ICD-10-CM | POA: Insufficient documentation

## 2021-09-29 LAB — CBC WITH DIFFERENTIAL/PLATELET
Abs Immature Granulocytes: 0.02 10*3/uL (ref 0.00–0.07)
Basophils Absolute: 0.1 10*3/uL (ref 0.0–0.1)
Basophils Relative: 1 %
Eosinophils Absolute: 0 10*3/uL (ref 0.0–0.5)
Eosinophils Relative: 0 %
HCT: 36.2 % (ref 36.0–46.0)
Hemoglobin: 12.1 g/dL (ref 12.0–15.0)
Immature Granulocytes: 0 %
Lymphocytes Relative: 10 %
Lymphs Abs: 0.9 10*3/uL (ref 0.7–4.0)
MCH: 30.6 pg (ref 26.0–34.0)
MCHC: 33.4 g/dL (ref 30.0–36.0)
MCV: 91.6 fL (ref 80.0–100.0)
Monocytes Absolute: 0.5 10*3/uL (ref 0.1–1.0)
Monocytes Relative: 6 %
Neutro Abs: 7.5 10*3/uL (ref 1.7–7.7)
Neutrophils Relative %: 83 %
Platelets: 365 10*3/uL (ref 150–400)
RBC: 3.95 MIL/uL (ref 3.87–5.11)
RDW: 13.3 % (ref 11.5–15.5)
WBC: 9 10*3/uL (ref 4.0–10.5)
nRBC: 0 % (ref 0.0–0.2)

## 2021-09-29 LAB — URINALYSIS, ROUTINE W REFLEX MICROSCOPIC
Bilirubin Urine: NEGATIVE
Glucose, UA: NEGATIVE mg/dL
Hgb urine dipstick: NEGATIVE
Ketones, ur: NEGATIVE mg/dL
Nitrite: NEGATIVE
Protein, ur: NEGATIVE mg/dL
Specific Gravity, Urine: 1.009 (ref 1.005–1.030)
pH: 7 (ref 5.0–8.0)

## 2021-09-29 LAB — BASIC METABOLIC PANEL
Anion gap: 8 (ref 5–15)
BUN: 8 mg/dL (ref 6–20)
CO2: 23 mmol/L (ref 22–32)
Calcium: 8.9 mg/dL (ref 8.9–10.3)
Chloride: 106 mmol/L (ref 98–111)
Creatinine, Ser: 0.95 mg/dL (ref 0.44–1.00)
GFR, Estimated: >60 mL/min (ref >60)
Glucose, Bld: 118 mg/dL — ABNORMAL HIGH (ref 70–99)
Potassium: 3.5 mmol/L (ref 3.5–5.1)
Sodium: 137 mmol/L (ref 135–145)

## 2021-09-29 LAB — I-STAT CHEM 8, ED
BUN: 6 mg/dL (ref 6–20)
Calcium, Ion: 1.16 mmol/L (ref 1.15–1.40)
Chloride: 103 mmol/L (ref 98–111)
Creatinine, Ser: 0.9 mg/dL (ref 0.44–1.00)
Glucose, Bld: 116 mg/dL — ABNORMAL HIGH (ref 70–99)
HCT: 40 % (ref 36.0–46.0)
Hemoglobin: 13.6 g/dL (ref 12.0–15.0)
Potassium: 3.6 mmol/L (ref 3.5–5.1)
Sodium: 141 mmol/L (ref 135–145)
TCO2: 26 mmol/L (ref 22–32)

## 2021-09-29 LAB — TSH: TSH: 1.901 u[IU]/mL (ref 0.350–4.500)

## 2021-09-29 LAB — RESP PANEL BY RT-PCR (FLU A&B, COVID) ARPGX2
Influenza A by PCR: NEGATIVE
Influenza B by PCR: NEGATIVE
SARS Coronavirus 2 by RT PCR: NEGATIVE

## 2021-09-29 LAB — I-STAT BETA HCG BLOOD, ED (MC, WL, AP ONLY): I-stat hCG, quantitative: <5 m[IU]/mL (ref <5)

## 2021-09-29 LAB — D-DIMER, QUANTITATIVE: D-Dimer, Quant: 0.29 ug/mL-FEU (ref 0.00–0.50)

## 2021-09-29 LAB — CBG MONITORING, ED: Glucose-Capillary: 108 mg/dL — ABNORMAL HIGH (ref 70–99)

## 2021-09-29 MED ORDER — SODIUM CHLORIDE 0.9 % IV BOLUS
1000.0000 mL | Freq: Once | INTRAVENOUS | Status: AC
  Administered 2021-09-29: 1000 mL via INTRAVENOUS

## 2021-09-29 NOTE — Discharge Instructions (Signed)
At this time there does not appear to be the presence of an emergent medical condition, however there is always the potential for conditions to change. Please read and follow the below instructions.  Please return to the Emergency Department immediately for any new or worsening symptoms. Please be sure to follow up with your Primary Care Provider within one week regarding your visit today; please call their office to schedule an appointment even if you are feeling better for a follow-up visit. Please inform your primary care provider about your elevated heart rate, the elevated heart rate appears to be present on several prior visits with them.  Go to the nearest Emergency Department immediately if: You have fever or chills You pass out or faint. You have any of these symptoms: Fast or uneven heartbeats (palpitations). Pain in your chest, belly, or back. Shortness of breath. You have a seizure. You have a very bad headache. You are confused. You have trouble seeing. You are very weak. You have trouble walking. You are bleeding from your mouth or butt. You have black or tarry poop (stool). You have any new/concerning or worsening of symptoms.   Please read the additional information packets attached to your discharge summary.  Do not take your medicine if  develop an itchy rash, swelling in your mouth or lips, or difficulty breathing; call 911 and seek immediate emergency medical attention if this occurs.  You may review your lab tests and imaging results in their entirety on your MyChart account.  Please discuss all results of fully with your primary care provider and other specialist at your follow-up visit.  Note: Portions of this text may have been transcribed using voice recognition software. Every effort was made to ensure accuracy; however, inadvertent computerized transcription errors may still be present.

## 2021-09-29 NOTE — ED Triage Notes (Signed)
Patient brought in by [facility] after a fall a [place].  Patient states that she felt dizzy and fell down 3 steps.  Patient denies hitting her head or loss of consciousness. Patient has no complaints at this time.

## 2021-09-29 NOTE — ED Provider Notes (Signed)
[facility] EMERGENCY DEPARTMENT Provider Note   CSN: [id number] Arrival date & time: 09/29/21  1106     History  Chief Complaint  Patient presents with   Kayla Shepherd is a 41 y.o. female presented with her parents today for near syncopal episode.  Patient was sitting at Sunday school today, she got up to walk outside, walking down the stairs she reports it but she became lightheaded, patient felt that she was going to fall, she was able to help herself to the ground.  Patient is unsure if she briefly lost consciousness.  She reports that bystanders had to help her off the ground.   Additional history was obtained from patient's mother and father who are at bedside.  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  The patient lives with her parents.  Parents were at church today when this occurred.  There is no history of head injury.  They deny chest pain, difficulty breathing, abdominal pain, nausea, vomiting, recent illness, dysuria/hematuria, numbness, tingling, weakness, history of blood clot, exogenous hormone use, recent surgery/immobilization, history of cancer, extremity swelling/color change, pleurisy, hemoptysis or any additional concerns.  HPI     Home Medications Prior to Admission medications   Medication Sig Start Date End Date Taking? Authorizing Provider  atomoxetine (STRATTERA) 40 MG capsule Take 1 capsule (40 mg total) by mouth daily. 06/18/21   Loman Brooklyn, FNP  buPROPion (WELLBUTRIN XL) 300 MG 24 hr tablet TAKE 1 TABLET ONCE DAILY 07/09/21   Loman Brooklyn, FNP  escitalopram (LEXAPRO) 10 MG tablet TAKE 1 TABLET ONCE DAILY 06/17/21   Loman Brooklyn, FNP  LORazepam (ATIVAN) 0.5 MG tablet Take 1 tablet (0.5 mg total) by mouth 2 (two) times daily as needed for anxiety. 06/04/21   Loman Brooklyn, FNP      Allergies    Patient has no known allergies.    Review of Systems   Review of Systems Ten  systems are reviewed and are negative for acute change except as noted in the HPI  Physical Exam Updated Vital Signs BP (!) 130/92 (BP Location: Right Arm)   Pulse (!) 115   Temp 98 F (36.7 C) (Oral)   Resp 20   Ht '4\' 11"'$  (1.499 m)   Wt 61.2 kg   SpO2 99%   BMI 27.27 kg/m  Physical Exam Constitutional:      General: She is not in acute distress.    Appearance: Normal appearance. She is well-developed. She is not ill-appearing or diaphoretic.  HENT:     Head: Normocephalic and atraumatic.     Jaw: There is normal jaw occlusion.     Right Ear: Tympanic membrane normal. No hemotympanum.     Left Ear: Tympanic membrane normal. No hemotympanum.  Eyes:     General: Vision grossly intact. Gaze aligned appropriately.     Pupils: Pupils are equal, round, and reactive to light.  Neck:     Trachea: Trachea and phonation normal.  Cardiovascular:     Rate and Rhythm: Regular rhythm. Tachycardia present.     Pulses: Normal pulses.     Heart sounds: Normal heart sounds.  Pulmonary:     Effort: Pulmonary effort is normal. No respiratory distress.     Breath sounds: Normal breath sounds.  Abdominal:     General: There is no distension.     Palpations: Abdomen is soft.  Tenderness: There is no abdominal tenderness. There is no guarding or rebound.  Musculoskeletal:        General: Normal range of motion.     Cervical back: Normal range of motion.     Right lower leg: No edema.     Left lower leg: No edema.     Comments: No midline spinal tenderness palpation.  No crepitus step-off deformity spine.  No tenderness palpation of the neck or back.  No tenderness palpation of the major joints of the bilateral upper or lower extremities.  Patient moves all 4 extremities spontaneously without pain.  Patient is able to pull to seated position without assistance or difficulty.  Skin:    General: Skin is warm and dry.  Neurological:     Mental Status: She is alert.     GCS: GCS eye subscore  is 4. GCS verbal subscore is 5. GCS motor subscore is 6.     Comments: Speech is clear and goal oriented, follows commands Major Cranial nerves without deficit, no facial droop Normal strength in upper and lower extremities bilaterally including dorsiflexion and plantar flexion, strong and equal grip strength Sensation normal to light and sharp touch Moves extremities without ataxia, coordination intact  Psychiatric:        Behavior: Behavior normal.    ED Results / Procedures / Treatments   Labs (all labs ordered are listed, but only abnormal results are displayed) Labs Reviewed  BASIC METABOLIC PANEL - Abnormal; Notable for the following components:      Result Value   Glucose, Bld 118 (*)    All other components within normal limits  URINALYSIS, ROUTINE W REFLEX MICROSCOPIC - Abnormal; Notable for the following components:   Leukocytes,Ua TRACE (*)    Bacteria, UA RARE (*)    All other components within normal limits  CBG MONITORING, ED - Abnormal; Notable for the following components:   Glucose-Capillary 108 (*)    All other components within normal limits  I-STAT CHEM 8, ED - Abnormal; Notable for the following components:   Glucose, Bld 116 (*)    All other components within normal limits  RESP PANEL BY RT-PCR (FLU A&B, COVID) ARPGX2  CBC WITH DIFFERENTIAL/PLATELET  TSH  I-STAT BETA HCG BLOOD, ED ([facility], [facility], [facility] ONLY)    EKG EKG Interpretation  Date/Time:  Sunday Sep 29 2021 11:30:16 EDT Ventricular Rate:  122 PR Interval:  143 QRS Duration: 93 QT Interval:  339 QTC Calculation: 483 R Axis:   89 Text Interpretation: Sinus tachycardia No previous ECGs available Confirmed by Fredia Sorrow [phone]) on 09/29/2021 4:43:36 PM  Radiology DG Chest 2 View  Result Date: 09/29/2021 CLINICAL DATA:  Near syncopal episode today.  Dizziness. EXAM: CHEST - 2 VIEW COMPARISON:  None Available. FINDINGS: The heart size and mediastinal contours are within normal limits. Both lungs are  clear. The visualized skeletal structures are unremarkable. IMPRESSION: No active cardiopulmonary disease. Electronically Signed   By: Marlaine Hind M.D.   On: 09/29/2021 11:51   CT Head Wo Contrast  Result Date: 09/29/2021 CLINICAL DATA:  Syncopal episode today. Fell down stairs. Hit head. Loss of consciousness. EXAM: CT HEAD WITHOUT CONTRAST TECHNIQUE: Contiguous axial images were obtained from the base of the skull through the vertex without intravenous contrast. RADIATION DOSE REDUCTION: This exam was performed according to the departmental dose-optimization program which includes automated exposure control, adjustment of the mA and/or kV according to patient size and/or use of iterative reconstruction technique. COMPARISON:  None  Available. FINDINGS: Brain: No evidence of intracranial hemorrhage, acute infarction, hydrocephalus, extra-axial collection, or mass lesion/mass effect. Vascular:  No hyperdense vessel or other acute findings. Skull: No evidence of fracture or other significant bone abnormality. Sinuses/Orbits:  No acute findings. Other: None. IMPRESSION: Negative noncontrast head CT. Electronically Signed   By: Marlaine Hind M.D.   On: 09/29/2021 12:03    Procedures Procedures    Medications Ordered in ED Medications  sodium chloride 0.9 % bolus 1,000 mL (0 mLs Intravenous Stopped 09/29/21 1552)  sodium chloride 0.9 % bolus 1,000 mL (0 mLs Intravenous Stopped 09/29/21 1552)    ED Course/ Medical Decision Making/ A&P Clinical Course as of 09/29/21 1737  Sun Sep 29, 2021  1514 Reviewed previous primary office visits going back over 1 year heart rate; tachycardia appears baseline. No additional testing indicated at this time.  EKG shows sinus tachycardia without acute ischemic changes.  Suspect this may be secondary to Strattera. [BM]  1514 I-Stat Beta hCG blood, ED ([facility], [facility], [facility] only) Negative pregnancy test, no evidence for ectopic as cause of near syncopal episode [BM]  1609  Urinalysis, Routine w reflex microscopic Urine, Clean Catch(!) No urinary symptoms, doubt UTI [BM]  1609 CBC with Differential Reassuring, no anemia or thrombocytopenia.  No leukocytosis to suggest infectious process [BM]  7846 Basic metabolic panel(!)  No emergent electrolyte derangement, AKI or gap. [BM]  1609 Resp Panel by RT-PCR (Flu A&B, Covid) Nasopharyngeal Swab  COVID and flu test negative [BM]  1609 POC CBG, ED(!) CBG 108 [BM]  1610 TSH TSH within normal limits, doubt hyperthyroidism as cause of tachycardia. [BM]  1640 Patient reassessed resting comfortably no acute distress.  Patient mildly tachycardic 105-110-110 bpm on the monitor.  SPO2 within normal limits.  No hypotension.  Patient reports that she feels well, no complaints or concerns.  Patient has had orthostatic vital signs today without hypotension or return of near syncope.  [BM]    Clinical Course User Index [BM] Deliah Boston, PA-C                           Medical Decision Making Amount and/or Complexity of Data Reviewed Independent Historian: parent    Details: Parents at bedside to provide supplemental history External Data Reviewed: notes.    Details: Reviewed prior primary care provider notes.  Vital signs from visits over the past year show persistent tachycardia Labs: ordered. Decision-making details documented in ED Course. Radiology: ordered and independent interpretation performed. ECG/medicine tests: ordered.  Risk Risk Details: 41 year old female presented after near syncopal episode at church earlier today, she was leaving Sunday school when this happened.  Patient was noted to be tachycardic on initial presentation otherwise vital signs unremarkable.  Patient denies any associated chest pain shortness of breath.  She denies any fall or injury from her near syncopal episode she was able to assist herself to the ground, does not believes that she lost full consciousness.  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  As above labs are overall reassuring, no emergent electrolyte derangements, no anemia, pregnancy test is negative.  Orthostatics are negative after patient received IV fluids here.  No hypoglycemia.  No chest pain to suggest AAA or dissection.  D-dimer is negative, low suspicion for PE.  No headache, reassuring  neurologic exam doubt subarachnoid hemorrhage.  No seizure activity witnessed by bystanders.  Overall suspect patient's near syncopal episode may be vagal or orthostatic in nature, no indication for admission to the hospital for further work-up at this time.  Patient was noted to be persistently tachycardic throughout this visit despite IV fluids however on further chart review from recent primary care provider visits over the past year it appears patient's baseline heart rate is in the 110s-120s, suspect this may be due to her Strattera.  I informed patient's parents of this and encouraged them to follow-up with PCP for recheck in the next week.  If symptoms return patient is to return to the ER for further evaluation.  Oral hydration and adequate p.o. take encouraged.    At this time there does not appear to be any evidence of an acute emergency medical condition and the patient appears stable for discharge with appropriate outpatient follow up. Diagnosis was discussed with patient who verbalizes understanding of care plan and is agreeable to discharge. I have discussed return precautions with patient and parents who verbalizes understanding. Patient encouraged to follow-up with their PCP. All questions answered.  Patient's case discussed with Dr. Alvino Chapel who agrees with plan to discharge with follow-up.   Note: Portions of this report may have been transcribed using voice recognition software. Every effort was  made to ensure accuracy; however, inadvertent computerized transcription errors may still be present.         Final Clinical Impression(s) / ED Diagnoses Final diagnoses:  Near syncope  Tachycardia    Rx / DC Orders ED Discharge Orders     None         Gari Crown 09/29/21 1756    Davonna Belling, MD 09/30/21 [phone]

## 2021-09-29 NOTE — ED Notes (Signed)
Patient maintained oxygen saturation of 96% while ambulating to the bathroom and back.

## 2021-09-30 ENCOUNTER — Encounter: Payer: Self-pay | Admitting: Family Medicine

## 2021-09-30 ENCOUNTER — Ambulatory Visit (INDEPENDENT_AMBULATORY_CARE_PROVIDER_SITE_OTHER): Payer: Medicare Other | Admitting: Family Medicine

## 2021-09-30 VITALS — BP 141/91 | HR 101 | Temp 96.3°F | Ht 59.0 in | Wt 137.6 lb

## 2021-09-30 DIAGNOSIS — F410 Panic disorder [episodic paroxysmal anxiety] without agoraphobia: Secondary | ICD-10-CM

## 2021-09-30 NOTE — Progress Notes (Signed)
Assessment & Plan:  1. Panic attacks Patient has started having panic attacks. She has a PRN prescription of Ativan, but tries to rarely take it. Encouraged patient to take Ativan as needed and let me know if she is needing it frequently as her long term maintenance medication for anxiety will need to be adjusted. Recommended keeping a journal of her symptoms, what she is feeling, and what she is doing when this starts. Discussed deep breathing exercises.    Return as scheduled.  Deliah Boston, MSN, APRN, FNP-C [facility]  Subjective:    Patient ID: Kayla Shepherd, female    DOB: [birth year], 41 y.o.   MRN: [id number]  Patient Care Team: Gwenlyn Fudge, FNP as PCP - General (Family Medicine)   Chief Complaint:  Chief Complaint  Patient presents with   ER follow up    AP 5/21- near syncope & tachycardia. Patient states she has not been dizzy since yesterday but she did get very sweaty while waiting in the waiting room.     HPI: Kayla Shepherd is a 41 y.o. female presenting on 09/30/2021 for ER follow up (AP 5/21- near syncope & tachycardia. Patient states she has not been dizzy since yesterday but she did get very sweaty while waiting in the waiting room. )  Patient is accompanied by her mother, who she is okay with being present.   Patient was seen in the ER yesterday after a near syncopal episode yesterday at church. She reports she was leaving Sunday school and headed down the stairs when she felt like she was going to pass out. She reports feeling anxious prior to the episode.   She reports four days prior she felt really hot and got very anxious. Her mom thought she might have been having a hot flash. She took an Ativan and felt better.   In the waiting room today she had the same feeling of getting very hot and nervous. She was sweating and again felt like she was going to pass out. She became nauseated and vomited in the exam room.    Social  history:  Relevant past medical, surgical, family and social history reviewed and updated as indicated. Interim medical history since our last visit reviewed.  Allergies and medications reviewed and updated.  DATA REVIEWED: CHART IN EPIC  ROS: Negative unless specifically indicated above in HPI.    Current Outpatient Medications:    atomoxetine (STRATTERA) 40 MG capsule, Take 1 capsule (40 mg total) by mouth daily., Disp: 90 capsule, Rfl: 3   buPROPion (WELLBUTRIN XL) 300 MG 24 hr tablet, TAKE 1 TABLET ONCE DAILY, Disp: 90 tablet, Rfl: 1   escitalopram (LEXAPRO) 10 MG tablet, TAKE 1 TABLET ONCE DAILY, Disp: 90 tablet, Rfl: 1   LORazepam (ATIVAN) 0.5 MG tablet, Take 1 tablet (0.5 mg total) by mouth 2 (two) times daily as needed for anxiety., Disp: 15 tablet, Rfl: 2   No Known Allergies Past Medical History:  Diagnosis Date   ADD (attention deficit disorder)    Depression     History reviewed. No pertinent surgical history.  Social History   Socioeconomic History   Marital status: Single    Spouse name: Not on file   Number of children: 0   Years of education: Not on file   Highest education level: Not on file  Occupational History   Occupation: disability  Tobacco Use   Smoking status: Never   Smokeless tobacco: Never  Vaping Use  Vaping Use: Never used  Substance and Sexual Activity   Alcohol use: No   Drug use: No   Sexual activity: Not on file  Other Topics Concern   Not on file  Social History Narrative   Lives with her parents   Social Determinants of Health   Financial Resource Strain: Low Risk    Difficulty of Paying Living Expenses: Not hard at all  Food Insecurity: No Food Insecurity   Worried About Programme researcher, broadcasting/film/video in the Last Year: Never true   Ran Out of Food in the Last Year: Never true  Transportation Needs: No Transportation Needs   Lack of Transportation (Medical): No   Lack of Transportation (Non-Medical): No  Physical Activity: Inactive    Days of Exercise per Week: 0 days   Minutes of Exercise per Session: 0 min  Stress: Stress Concern Present   Feeling of Stress : To some extent  Social Connections: Moderately Isolated   Frequency of Communication with Friends and Family: More than three times a week   Frequency of Social Gatherings with Friends and Family: More than three times a week   Attends Religious Services: More than 4 times per year   Active Member of [employer] or Organizations: No   Attends Banker Meetings: Never   Marital Status: Never married  Catering manager Violence: Not At Risk   Fear of Current or Ex-Partner: No   Emotionally Abused: No   Physically Abused: No   Sexually Abused: No        Objective:    BP (!) 141/91   Pulse (!) 101   Temp (!) 96.3 F (35.7 C) (Temporal)   Ht 4\' 11"  (1.499 m)   Wt 137 lb 9.6 oz (62.4 kg)   SpO2 100%   BMI 27.79 kg/m   Wt Readings from Last 3 Encounters:  09/30/21 137 lb 9.6 oz (62.4 kg)  09/29/21 135 lb (61.2 kg)  06/04/21 135 lb 3.2 oz (61.3 kg)    Physical Exam Vitals reviewed.  Constitutional:      General: She is not in acute distress.    Appearance: Normal appearance. She is diaphoretic. She is not ill-appearing or toxic-appearing.  HENT:     Head: Normocephalic and atraumatic.  Eyes:     General: No scleral icterus.       Right eye: No discharge.        Left eye: No discharge.     Conjunctiva/sclera: Conjunctivae normal.  Cardiovascular:     Rate and Rhythm: Normal rate.  Pulmonary:     Effort: Pulmonary effort is normal. No respiratory distress.  Musculoskeletal:        General: Normal range of motion.     Cervical back: Normal range of motion.  Skin:    General: Skin is warm.     Capillary Refill: Capillary refill takes less than 2 seconds.  Neurological:     General: No focal deficit present.     Mental Status: She is alert and oriented to person, place, and time. Mental status is at baseline.  Psychiatric:         Mood and Affect: Mood is anxious.        Behavior: Behavior normal.        Thought Content: Thought content normal.        Judgment: Judgment normal.    Lab Results  Component Value Date   TSH 1.901 09/29/2021   Lab Results  Component Value Date  WBC 9.0 09/29/2021   HGB 13.6 09/29/2021   HCT 40.0 09/29/2021   MCV 91.6 09/29/2021   PLT 365 09/29/2021   Lab Results  Component Value Date   NA 141 09/29/2021   K 3.6 09/29/2021   CO2 23 09/29/2021   GLUCOSE 116 (H) 09/29/2021   BUN 6 09/29/2021   CREATININE 0.90 09/29/2021   BILITOT 0.2 06/04/2021   ALKPHOS 68 06/04/2021   AST 17 06/04/2021   ALT 13 06/04/2021   PROT 7.2 06/04/2021   ALBUMIN 4.7 06/04/2021   CALCIUM 8.9 09/29/2021   ANIONGAP 8 09/29/2021   EGFR 71 06/04/2021   Lab Results  Component Value Date   CHOL 192 06/04/2021   Lab Results  Component Value Date   HDL 44 06/04/2021   Lab Results  Component Value Date   LDLCALC 131 (H) 06/04/2021   Lab Results  Component Value Date   TRIG 92 06/04/2021   Lab Results  Component Value Date   CHOLHDL 4.4 06/04/2021   No results found for: HGBA1C

## 2021-11-01 DIAGNOSIS — H5213 Myopia, bilateral: Secondary | ICD-10-CM | POA: Diagnosis not present

## 2021-12-03 ENCOUNTER — Encounter: Payer: Self-pay | Admitting: Family Medicine

## 2021-12-03 ENCOUNTER — Ambulatory Visit (INDEPENDENT_AMBULATORY_CARE_PROVIDER_SITE_OTHER): Payer: Medicare Other | Admitting: Family Medicine

## 2021-12-03 VITALS — BP 125/88 | HR 112 | Temp 97.8°F | Ht 59.0 in | Wt 142.6 lb

## 2021-12-03 DIAGNOSIS — Z79899 Other long term (current) drug therapy: Secondary | ICD-10-CM

## 2021-12-03 DIAGNOSIS — F988 Other specified behavioral and emotional disorders with onset usually occurring in childhood and adolescence: Secondary | ICD-10-CM

## 2021-12-03 DIAGNOSIS — F419 Anxiety disorder, unspecified: Secondary | ICD-10-CM | POA: Diagnosis not present

## 2021-12-03 MED ORDER — BUPROPION HCL ER (XL) 300 MG PO TB24: 300.0000 mg | ORAL_TABLET | Freq: Every day | ORAL | 1 refills | Status: DC

## 2021-12-03 MED ORDER — ESCITALOPRAM OXALATE 10 MG PO TABS: 10.0000 mg | ORAL_TABLET | Freq: Every day | ORAL | 1 refills | Status: DC

## 2021-12-03 MED ORDER — LORAZEPAM 0.5 MG PO TABS: 0.5000 mg | ORAL_TABLET | Freq: Two times a day (BID) | ORAL | 2 refills | Status: DC | PRN

## 2021-12-03 NOTE — Progress Notes (Signed)
Assessment & Plan:  1. Anxiety Well controlled on current regimen with Ativan as needed.  - buPROPion (WELLBUTRIN XL) 300 MG 24 hr tablet; Take 1 tablet (300 mg total) by mouth daily.  Dispense: 90 tablet; Refill: 1 - escitalopram (LEXAPRO) 10 MG tablet; Take 1 tablet (10 mg total) by mouth daily.  Dispense: 90 tablet; Refill: 1 - LORazepam (ATIVAN) 0.5 MG tablet; Take 1 tablet (0.5 mg total) by mouth 2 (two) times daily as needed for anxiety.  Dispense: 15 tablet; Refill: 2 - ToxASSURE Select 13 (MW), Urine  2. Controlled substance agreement signed Controlled substance agreement in place and updated today. Urine drug screen collected today. PDMP reviewed with no concerning findings. - LORazepam (ATIVAN) 0.5 MG tablet; Take 1 tablet (0.5 mg total) by mouth 2 (two) times daily as needed for anxiety.  Dispense: 15 tablet; Refill: 2 - ToxASSURE Select 13 (MW), Urine  3. Attention deficit disorder, unspecified hyperactivity presence Well controlled on current regimen.    Return in about 6 months (around 06/05/2022) for annual physical with Dr. [name] (ok'd by Dr. [name] to accept as new patient).  Kayla Limes, MSN, APRN, FNP-C [facility]  Subjective:    Patient ID: Kayla Shepherd, female    DOB: [birth year], 41 y.o.   MRN: [id number]  Patient Care Team: Loman Brooklyn, FNP as PCP - General (Family Medicine)   Chief Complaint:  Chief Complaint  Patient presents with   Medical Management of Chronic Issues    HPI: Kayla Shepherd is a 41 y.o. female presenting on 12/03/2021 for Medical Management of Chronic Issues  Patient is accompanied by her mother who she is okay with being present.  Anxiety: Doing well with Lexapro 10 mg once daily.  She has a prescription for Ativan that she takes as needed, which is not very often.       12/03/2021    5:24 PM 09/30/2021    3:02 PM 06/04/2021    3:01 PM 11/29/2020    3:49 PM  GAD 7 : Generalized Anxiety Score  Nervous,  Anxious, on Edge 0 0 0 0  Control/stop worrying 0 0 0 0  Worry too much - different things 0 0 0 0  Trouble relaxing 0 0 0 0  Restless 0 0 0 0  Easily annoyed or irritable 0 0 0 0  Afraid - awful might happen 0 0 0 0  Total GAD 7 Score 0 0 0 0  Anxiety Difficulty Not difficult at all Not difficult at all Not difficult at all Not difficult at all    ADHD: doing well with Strattera.  New complaints: None   Social history:  Relevant past medical, surgical, family and social history reviewed and updated as indicated. Interim medical history since our last visit reviewed.  Allergies and medications reviewed and updated.  DATA REVIEWED: CHART IN EPIC  ROS: Negative unless specifically indicated above in HPI.    Current Outpatient Medications:    atomoxetine (STRATTERA) 40 MG capsule, Take 1 capsule (40 mg total) by mouth daily., Disp: 90 capsule, Rfl: 3   buPROPion (WELLBUTRIN XL) 300 MG 24 hr tablet, TAKE 1 TABLET ONCE DAILY, Disp: 90 tablet, Rfl: 1   escitalopram (LEXAPRO) 10 MG tablet, TAKE 1 TABLET ONCE DAILY, Disp: 90 tablet, Rfl: 1   LORazepam (ATIVAN) 0.5 MG tablet, Take 1 tablet (0.5 mg total) by mouth 2 (two) times daily as needed for anxiety., Disp: 15 tablet, Rfl: 2   No  Known Allergies Past Medical History:  Diagnosis Date   ADD (attention deficit disorder)    Depression     History reviewed. No pertinent surgical history.  Social History   Socioeconomic History   Marital status: Single    Spouse name: Not on file   Number of children: 0   Years of education: Not on file   Highest education level: Not on file  Occupational History   Occupation: disability  Tobacco Use   Smoking status: Never   Smokeless tobacco: Never  Vaping Use   Vaping Use: Never used  Substance and Sexual Activity   Alcohol use: No   Drug use: No   Sexual activity: Not on file  Other Topics Concern   Not on file  Social History Narrative   Lives with her parents   Social  Determinants of Health   Financial Resource Strain: Low Risk  (02/04/2021)   Overall Financial Resource Strain (CARDIA)    Difficulty of Paying Living Expenses: Not hard at all  Food Insecurity: No [place] (02/04/2021)   Hunger Vital Sign    Worried About Running Out of Food in the Last Year: Never true    [place] in the Last Year: Never true  Transportation Needs: No Transportation Needs (02/04/2021)   PRAPARE - Hydrologist (Medical): No    Lack of Transportation (Non-Medical): No  Physical Activity: Inactive (02/04/2021)   Exercise Vital Sign    Days of Exercise per Week: 0 days    Minutes of Exercise per Session: 0 min  Stress: Stress Concern Present (02/04/2021)   [place]    Feeling of Stress : To some extent  Social Connections: Moderately Isolated (02/04/2021)   Social Connection and Isolation Panel [NHANES]    Frequency of Communication with Friends and Family: More than three times a week    Frequency of Social Gatherings with Friends and Family: More than three times a week    Attends Religious Services: More than 4 times per year    Active Member of [employer] or Organizations: No    Attends Archivist Meetings: Never    Marital Status: Never married  Intimate Partner Violence: Not At Risk (02/04/2021)   Humiliation, Afraid, Rape, and Kick questionnaire    Fear of Current or Ex-Partner: No    Emotionally Abused: No    Physically Abused: No    Sexually Abused: No        Objective:    BP 125/88   Pulse (!) 112   Temp 97.8 F (36.6 C) (Temporal)   Ht $R'4\' 11"'qN$  (1.499 m)   Wt 142 lb 9.6 oz (64.7 kg)   LMP 11/19/2021 (Approximate)   SpO2 100%   BMI 28.80 kg/m   Wt Readings from Last 3 Encounters:  12/03/21 142 lb 9.6 oz (64.7 kg)  09/30/21 137 lb 9.6 oz (62.4 kg)  09/29/21 135 lb (61.2 kg)    Physical Exam Vitals reviewed.  Constitutional:       General: She is not in acute distress.    Appearance: Normal appearance. She is overweight. She is not ill-appearing, toxic-appearing or diaphoretic.  HENT:     Head: Normocephalic and atraumatic.  Eyes:     General: No scleral icterus.       Right eye: No discharge.        Left eye: No discharge.     Conjunctiva/sclera: Conjunctivae normal.  Cardiovascular:     Rate and Rhythm: Normal rate and regular rhythm.     Heart sounds: Normal heart sounds. No murmur heard.    No friction rub. No gallop.  Pulmonary:     Effort: Pulmonary effort is normal. No respiratory distress.     Breath sounds: Normal breath sounds. No stridor. No wheezing, rhonchi or rales.  Musculoskeletal:        General: Normal range of motion.     Cervical back: Normal range of motion.  Skin:    General: Skin is warm and dry.     Capillary Refill: Capillary refill takes less than 2 seconds.  Neurological:     General: No focal deficit present.     Mental Status: She is alert and oriented to person, place, and time. Mental status is at baseline.  Psychiatric:        Mood and Affect: Mood normal.        Behavior: Behavior normal.        Thought Content: Thought content normal.        Judgment: Judgment normal.     Lab Results  Component Value Date   TSH 1.901 09/29/2021   Lab Results  Component Value Date   WBC 9.0 09/29/2021   HGB 13.6 09/29/2021   HCT 40.0 09/29/2021   MCV 91.6 09/29/2021   PLT 365 09/29/2021   Lab Results  Component Value Date   NA 141 09/29/2021   K 3.6 09/29/2021   CO2 23 09/29/2021   GLUCOSE 116 (H) 09/29/2021   BUN 6 09/29/2021   CREATININE 0.90 09/29/2021   BILITOT 0.2 06/04/2021   ALKPHOS 68 06/04/2021   AST 17 06/04/2021   ALT 13 06/04/2021   PROT 7.2 06/04/2021   ALBUMIN 4.7 06/04/2021   CALCIUM 8.9 09/29/2021   ANIONGAP 8 09/29/2021   EGFR 71 06/04/2021   Lab Results  Component Value Date   CHOL 192 06/04/2021   Lab Results  Component Value Date   HDL 44  06/04/2021   Lab Results  Component Value Date   LDLCALC 131 (H) 06/04/2021   Lab Results  Component Value Date   TRIG 92 06/04/2021   Lab Results  Component Value Date   CHOLHDL 4.4 06/04/2021   No results found for: "HGBA1C"

## 2021-12-10 LAB — TOXASSURE SELECT 13 (MW), URINE

## 2022-02-05 ENCOUNTER — Ambulatory Visit: Payer: Medicare Other

## 2022-04-01 ENCOUNTER — Ambulatory Visit (INDEPENDENT_AMBULATORY_CARE_PROVIDER_SITE_OTHER): Payer: Medicare Other

## 2022-04-01 DIAGNOSIS — Z23 Encounter for immunization: Secondary | ICD-10-CM

## 2022-05-24 ENCOUNTER — Other Ambulatory Visit: Payer: Self-pay | Admitting: Family Medicine

## 2022-05-24 DIAGNOSIS — F419 Anxiety disorder, unspecified: Secondary | ICD-10-CM

## 2022-05-24 DIAGNOSIS — F988 Other specified behavioral and emotional disorders with onset usually occurring in childhood and adolescence: Secondary | ICD-10-CM

## 2022-05-26 NOTE — Telephone Encounter (Signed)
Last office visit 12/03/21 Upcoming appointment 06/06/22  Lexapro last refill 12/03/21, #90, 1 refill Bupropion last refill 12/03/21, #90, 1 refill Straterra last refill 06/18/21, #90, 3 refills

## 2022-06-06 ENCOUNTER — Encounter: Payer: Self-pay | Admitting: Family Medicine

## 2022-06-06 ENCOUNTER — Encounter: Payer: Medicare Other | Admitting: Family Medicine

## 2022-07-11 ENCOUNTER — Telehealth: Payer: Self-pay | Admitting: Family Medicine

## 2022-07-11 NOTE — Telephone Encounter (Signed)
Contacted Kayla Shepherd to schedule their annual wellness visit. Appointment made for 07/28/2022.   Thank you,  Kayla Shepherd,  [place] Program Direct Dial ??[phone]

## 2022-07-28 ENCOUNTER — Telehealth: Payer: Self-pay | Admitting: Family Medicine

## 2022-07-28 ENCOUNTER — Ambulatory Visit: Payer: Medicare Other

## 2022-07-28 NOTE — Telephone Encounter (Signed)
Contacted Barbaraann Cao to schedule their annual wellness visit. Appointment made for 08/06/2022.  Thank you,  Colletta Maryland,  [place] Program Direct Dial ??[phone]

## 2022-08-06 ENCOUNTER — Ambulatory Visit: Payer: Medicare Other

## 2022-09-03 ENCOUNTER — Telehealth: Payer: Self-pay | Admitting: Family Medicine

## 2022-09-03 NOTE — Telephone Encounter (Signed)
Called patient to schedule Medicare Annual Wellness Visit (AWV). No voicemail available to leave a message.  Last date of AWV: 02/04/2021   Please schedule an appointment at any time with Vernona Rieger, [facility]. .  If any questions, please contact me at [phone].  Thank you,  Judeth Cornfield,  AMB Clinical Support [facility] AWV Program Direct Dial ??[phone]

## 2022-09-19 ENCOUNTER — Ambulatory Visit (INDEPENDENT_AMBULATORY_CARE_PROVIDER_SITE_OTHER): Payer: Medicare Other

## 2022-09-19 VITALS — Ht 60.0 in | Wt 142.0 lb

## 2022-09-19 DIAGNOSIS — Z1231 Encounter for screening mammogram for malignant neoplasm of breast: Secondary | ICD-10-CM | POA: Diagnosis not present

## 2022-09-19 DIAGNOSIS — Z0000 Encounter for general adult medical examination without abnormal findings: Secondary | ICD-10-CM

## 2022-09-19 NOTE — Patient Instructions (Signed)
Kayla Shepherd , Thank you for taking time to come for your Medicare Wellness Visit. I appreciate your ongoing commitment to your health goals. Please review the following plan we discussed and let me know if I can assist you in the future.   These are the goals we discussed:  Goals      Exercise 3x per week (30 min per time)        This is a list of the screening recommended for you and due dates:  Health Maintenance  Topic Date Due   DTaP/Tdap/Td vaccine (1 - Tdap) Never done   Pap Smear  Never done   COVID-19 Vaccine (4 - 2023-24 season) 01/10/2022   Flu Shot  12/11/2022   Medicare Annual Wellness Visit  09/19/2023   Hepatitis C Screening: USPSTF Recommendation to screen - Ages 18-79 yo.  Completed   HIV Screening  Completed   HPV Vaccine  Aged Out    Advanced directives: Advance directive discussed with you today. I have provided a copy for you to complete at home and have notarized. Once this is complete please bring a copy in to our office so we can scan it into your chart.   Conditions/risks identified: Aim for 30 minutes of exercise or brisk walking, 6-8 glasses of water, and 5 servings of fruits and vegetables each day.   Next appointment: Follow up in one year for your annual wellness visit.   Preventive Care 40-64 Years, Female Preventive care refers to lifestyle choices and visits with your health care provider that can promote health and wellness. What does preventive care include? A yearly physical exam. This is also called an annual well check. Dental exams once or twice a year. Routine eye exams. Ask your health care provider how often you should have your eyes checked. Personal lifestyle choices, including: Daily care of your teeth and gums. Regular physical activity. Eating a healthy diet. Avoiding tobacco and drug use. Limiting alcohol use. Practicing safe sex. Taking low-dose aspirin daily starting at age 6. Taking vitamin and mineral supplements as  recommended by your health care provider. What happens during an annual well check? The services and screenings done by your health care provider during your annual well check will depend on your age, overall health, lifestyle risk factors, and family history of disease. Counseling  Your health care provider may ask you questions about your: Alcohol use. Tobacco use. Drug use. Emotional well-being. Home and relationship well-being. Sexual activity. Eating habits. Work and work Astronomer. Method of birth control. Menstrual cycle. Pregnancy history. Screening  You may have the following tests or measurements: Height, weight, and BMI. Blood pressure. Lipid and cholesterol levels. These may be checked every 5 years, or more frequently if you are over 85 years old. Skin check. Lung cancer screening. You may have this screening every year starting at age 74 if you have a 30-pack-year history of smoking and currently smoke or have quit within the past 15 years. Fecal occult blood test (FOBT) of the stool. You may have this test every year starting at age 68. Flexible sigmoidoscopy or colonoscopy. You may have a sigmoidoscopy every 5 years or a colonoscopy every 10 years starting at age 5. Hepatitis C blood test. Hepatitis B blood test. Sexually transmitted disease (STD) testing. Diabetes screening. This is done by checking your blood sugar (glucose) after you have not eaten for a while (fasting). You may have this done every 1-3 years. Mammogram. This may be done every 1-2 years.  Talk to your health care provider about when you should start having regular mammograms. This may depend on whether you have a family history of breast cancer. BRCA-related cancer screening. This may be done if you have a family history of breast, ovarian, tubal, or peritoneal cancers. Pelvic exam and Pap test. This may be done every 3 years starting at age 5. Starting at age 21, this may be done every 5 years if  you have a Pap test in combination with an HPV test. Bone density scan. This is done to screen for osteoporosis. You may have this scan if you are at high risk for osteoporosis. Discuss your test results, treatment options, and if necessary, the need for more tests with your health care provider. Vaccines  Your health care provider may recommend certain vaccines, such as: Influenza vaccine. This is recommended every year. Tetanus, diphtheria, and acellular pertussis (Tdap, Td) vaccine. You may need a Td booster every 10 years. Zoster vaccine. You may need this after age 35. Pneumococcal 13-valent conjugate (PCV13) vaccine. You may need this if you have certain conditions and were not previously vaccinated. Pneumococcal polysaccharide (PPSV23) vaccine. You may need one or two doses if you smoke cigarettes or if you have certain conditions. Talk to your health care provider about which screenings and vaccines you need and how often you need them. This information is not intended to replace advice given to you by your health care provider. Make sure you discuss any questions you have with your health care provider. Document Released: 05/25/2015 Document Revised: 01/16/2016 Document Reviewed: 02/27/2015 Elsevier Interactive Patient Education  2017 [place] Prevention in the Home Falls can cause injuries. They can happen to people of all ages. There are many things you can do to make your home safe and to help prevent falls. What can I do on the outside of my home? Regularly fix the edges of walkways and driveways and fix any cracks. Remove anything that might make you trip as you walk through a door, such as a raised step or threshold. Trim any bushes or trees on the path to your home. Use bright outdoor lighting. Clear any walking paths of anything that might make someone trip, such as rocks or tools. Regularly check to see if handrails are loose or broken. Make sure that both  sides of any steps have handrails. Any raised decks and porches should have guardrails on the edges. Have any leaves, snow, or ice cleared regularly. Use sand or salt on walking paths during winter. Clean up any spills in your garage right away. This includes oil or grease spills. What can I do in the bathroom? Use night lights. Install grab bars by the toilet and in the tub and shower. Do not use towel bars as grab bars. Use non-skid mats or decals in the tub or shower. If you need to sit down in the shower, use a plastic, non-slip stool. Keep the floor dry. Clean up any water that spills on the floor as soon as it happens. Remove soap buildup in the tub or shower regularly. Attach bath mats securely with double-sided non-slip rug tape. Do not have throw rugs and other things on the floor that can make you trip. What can I do in the bedroom? Use night lights. Make sure that you have a light by your bed that is easy to reach. Do not use any sheets or blankets that are too big for your bed. They should  not hang down onto the floor. Have a firm chair that has side arms. You can use this for support while you get dressed. Do not have throw rugs and other things on the floor that can make you trip. What can I do in the kitchen? Clean up any spills right away. Avoid walking on wet floors. Keep items that you use a lot in easy-to-reach places. If you need to reach something above you, use a strong step stool that has a grab bar. Keep electrical cords out of the way. Do not use floor polish or wax that makes floors slippery. If you must use wax, use non-skid floor wax. Do not have throw rugs and other things on the floor that can make you trip. What can I do with my stairs? Do not leave any items on the stairs. Make sure that there are handrails on both sides of the stairs and use them. Fix handrails that are broken or loose. Make sure that handrails are as long as the stairways. Check any  carpeting to make sure that it is firmly attached to the stairs. Fix any carpet that is loose or worn. Avoid having throw rugs at the top or bottom of the stairs. If you do have throw rugs, attach them to the floor with carpet tape. Make sure that you have a light switch at the top of the stairs and the bottom of the stairs. If you do not have them, ask someone to add them for you. What else can I do to help prevent falls? Wear shoes that: Do not have high heels. Have rubber bottoms. Are comfortable and fit you well. Are closed at the toe. Do not wear sandals. If you use a stepladder: Make sure that it is fully opened. Do not climb a closed stepladder. Make sure that both sides of the stepladder are locked into place. Ask someone to hold it for you, if possible. Clearly mark and make sure that you can see: Any grab bars or handrails. First and last steps. Where the edge of each step is. Use tools that help you move around (mobility aids) if they are needed. These include: Canes. Walkers. Scooters. Crutches. Turn on the lights when you go into a dark area. Replace any light bulbs as soon as they burn out. Set up your furniture so you have a clear path. Avoid moving your furniture around. If any of your floors are uneven, fix them. If there are any pets around you, be aware of where they are. Review your medicines with your doctor. Some medicines can make you feel dizzy. This can increase your chance of falling. Ask your doctor what other things that you can do to help prevent falls. This information is not intended to replace advice given to you by your health care provider. Make sure you discuss any questions you have with your health care provider. Document Released: 02/22/2009 Document Revised: 10/04/2015 Document Reviewed: 06/02/2014 Elsevier Interactive Patient Education  2017 [employer].

## 2022-09-19 NOTE — Progress Notes (Signed)
Subjective:   Kayla Shepherd is a 42 y.o. female who presents for Medicare Annual (Subsequent) preventive examination. I connected with  Robbie Louis on 09/19/22 by a audio enabled telemedicine application and verified that I am speaking with the correct person using two identifiers.  Patient Location: Home  Provider Location: Home Office  I discussed the limitations of evaluation and management by telemedicine. The patient expressed understanding and agreed to proceed.  Review of Systems     Cardiac Risk Factors include: none     Objective:    Today's Vitals   09/19/22 1457  Weight: 142 lb (64.4 kg)  Height: 5' (1.524 m)   Body mass index is 27.73 kg/m.     09/19/2022    2:59 PM 09/29/2021   11:26 AM  Advanced Directives  Does Patient Have a Medical Advance Directive? No No  Would patient like information on creating a medical advance directive? No - Patient declined     Current Medications (verified) Outpatient Encounter Medications as of 09/19/2022  Medication Sig   atomoxetine (STRATTERA) 40 MG capsule TAKE 1 CAPSULE ONCE DAILY   buPROPion (WELLBUTRIN XL) 300 MG 24 hr tablet TAKE 1 TABLET DAILY   escitalopram (LEXAPRO) 10 MG tablet TAKE 1 TABLET DAILY   LORazepam (ATIVAN) 0.5 MG tablet Take 1 tablet (0.5 mg total) by mouth 2 (two) times daily as needed for anxiety.   No facility-administered encounter medications on file as of 09/19/2022.    Allergies (verified) Patient has no known allergies.   History: Past Medical History:  Diagnosis Date   ADD (attention deficit disorder)    Depression    History reviewed. No pertinent surgical history. Family History  Problem Relation Age of Onset   Hyperlipidemia Father    Social History   Socioeconomic History   Marital status: Single    Spouse name: Not on file   Number of children: 0   Years of education: Not on file   Highest education level: Not on file  Occupational History   Occupation:  disability  Tobacco Use   Smoking status: Never   Smokeless tobacco: Never  Vaping Use   Vaping Use: Never used  Substance and Sexual Activity   Alcohol use: No   Drug use: No   Sexual activity: Not on file  Other Topics Concern   Not on file  Social History Narrative   Lives with her parents   Social Determinants of Health   Financial Resource Strain: Low Risk  (09/19/2022)   Overall Financial Resource Strain (CARDIA)    Difficulty of Paying Living Expenses: Not hard at all  Food Insecurity: No Food Insecurity (09/19/2022)   Hunger Vital Sign    Worried About Running Out of Food in the Last Year: Never true    Ran Out of Food in the Last Year: Never true  Transportation Needs: No Transportation Needs (09/19/2022)   PRAPARE - Administrator, Civil Service (Medical): No    Lack of Transportation (Non-Medical): No  Physical Activity: Insufficiently Active (09/19/2022)   Exercise Vital Sign    Days of Exercise per Week: 3 days    Minutes of Exercise per Session: 30 min  Stress: No Stress Concern Present (09/19/2022)   [employer] of Occupational Health - Occupational Stress Questionnaire    Feeling of Stress : Not at all  Social Connections: Moderately Isolated (09/19/2022)   Social Connection and Isolation Panel [NHANES]    Frequency of Communication with Friends  and Family: More than three times a week    Frequency of Social Gatherings with Friends and Family: More than three times a week    Attends Religious Services: More than 4 times per year    Active Member of [employer] or Organizations: No    Attends Engineer, structural: Never    Marital Status: Never married    Tobacco Counseling Counseling given: Not Answered   Clinical Intake:  Pre-visit preparation completed: Yes  Pain : No/denies pain     Nutritional Risks: None Diabetes: No  How often do you need to have someone help you when you read instructions, pamphlets, or other written  materials from your doctor or pharmacy?: 1 - Never  Diabetic?no   Interpreter Needed?: No  Information entered by :: Renie Ora, LPN   Activities of Daily Living    09/19/2022    3:00 PM 09/19/2022    2:59 PM  In your present state of health, do you have any difficulty performing the following activities:  Hearing? 0 0  Vision? 0 0  Difficulty concentrating or making decisions? 0 0  Walking or climbing stairs? 0 0  Dressing or bathing? 0 0  Doing errands, shopping? 0 0  Preparing Food and eating ? N N  Using the Toilet? N N  In the past six months, have you accidently leaked urine? N N  Do you have problems with loss of bowel control? N N  Managing your Medications? N N  Managing your Finances? N N  Housekeeping or managing your Housekeeping? N N    Patient Care Team: Raliegh Ip, DO as PCP - General (Family Medicine)  Indicate any recent Medical Services you may have received from other than [facility] providers in the past year (date may be approximate).     Assessment:   This is a routine wellness examination for [place].  Hearing/Vision screen Vision Screening - Comments:: Wears rx glasses - up to date with routine eye exams with  Dr.Groat   Dietary issues and exercise activities discussed: Current Exercise Habits: Home exercise routine, Type of exercise: walking, Time (Minutes): 30, Frequency (Times/Week): 3, Weekly Exercise (Minutes/Week): 90, Intensity: Mild, Exercise limited by: None identified   Goals Addressed             This Visit's Progress    Exercise 3x per week (30 min per time)   On track      Depression Screen    09/19/2022    2:58 PM 09/30/2021    3:02 PM 06/04/2021    3:00 PM 02/04/2021    9:51 AM 11/29/2020    3:48 PM 06/01/2020    4:20 PM 11/30/2019   11:23 AM  PHQ 2/9 Scores  PHQ - 2 Score 0 0 0 0 0 0 1  PHQ- 9 Score  0 0  0 0 1    Fall Risk    09/19/2022    2:57 PM 09/30/2021    3:02 PM 06/04/2021    3:00 PM 02/04/2021    9:56  AM 10/19/2019   10:58 AM  Fall Risk   Falls in the past year? 0 1 0 0 0  Number falls in past yr:  0  0   Injury with Fall?  0  0   Risk for fall due to :    No Fall Risks   Follow up  Falls prevention discussed  Falls prevention discussed     FALL RISK PREVENTION PERTAINING TO THE  HOME:  Any stairs in or around the home? Yes  If so, are there any without handrails? No  Home free of loose throw rugs in walkways, pet beds, electrical cords, etc? Yes  Adequate lighting in your home to reduce risk of falls? Yes   ASSISTIVE DEVICES UTILIZED TO PREVENT FALLS:  Life alert? No  Use of a cane, walker or w/c? No  Grab bars in the bathroom? Yes  Shower chair or bench in shower? Yes  Elevated toilet seat or a handicapped toilet? Yes          09/19/2022    3:00 PM  6CIT Screen  What Year? 0 points  What month? 0 points  What time? 0 points  Count back from 20 0 points  Months in reverse 0 points  Repeat phrase 0 points  Total Score 0 points    Immunizations Immunization History  Administered Date(s) Administered   Influenza,inj,Quad PF,6+ Mos 03/26/2016, 03/18/2017, 03/17/2018, 03/23/2019, 06/01/2020, 03/15/2021, 04/01/2022   Moderna Sars-Covid-2 Vaccination 08/16/2019, 09/13/2019, 05/22/2020    TDAP status: Due, Education has been provided regarding the importance of this vaccine. Advised may receive this vaccine at local pharmacy or Health Dept. Aware to provide a copy of the vaccination record if obtained from local pharmacy or Health Dept. Verbalized acceptance and understanding.  Flu Vaccine status: Up to date  Pneumococcal vaccine status: Declined,  Education has been provided regarding the importance of this vaccine but patient still declined. Advised may receive this vaccine at local pharmacy or Health Dept. Aware to provide a copy of the vaccination record if obtained from local pharmacy or Health Dept. Verbalized acceptance and understanding.   Covid-19 vaccine status:  Completed vaccines  Qualifies for Shingles Vaccine? No   Zostavax completed No   Shingrix Completed?: No.    Education has been provided regarding the importance of this vaccine. Patient has been advised to call insurance company to determine out of pocket expense if they have not yet received this vaccine. Advised may also receive vaccine at local pharmacy or Health Dept. Verbalized acceptance and understanding.  Screening Tests Health Maintenance  Topic Date Due   DTaP/Tdap/Td (1 - Tdap) Never done   PAP SMEAR-Modifier  Never done   COVID-19 Vaccine (4 - 2023-24 season) 01/10/2022   INFLUENZA VACCINE  12/11/2022   Medicare Annual Wellness (AWV)  09/19/2023   Hepatitis C Screening  Completed   HIV Screening  Completed   HPV VACCINES  Aged Out    Health Maintenance  Health Maintenance Due  Topic Date Due   DTaP/Tdap/Td (1 - Tdap) Never done   PAP SMEAR-Modifier  Never done   COVID-19 Vaccine (4 - 2023-24 season) 01/10/2022    Colorectal cancer screening: Referral to GI placed not of age . Pt aware the office will call re: appt.  Mammogram status: Ordered 09/19/2022. Pt provided with contact info and advised to call to schedule appt.   Bone Density status: Ordered not of age . Pt provided with contact info and advised to call to schedule appt.  Lung Cancer Screening: (Low Dose CT Chest recommended if Age 10-80 years, 30 pack-year currently smoking OR have quit w/in 15years.) does not qualify.   Lung Cancer Screening Referral: n/a  Additional Screening:  Hepatitis C Screening: does not qualify;   Vision Screening: Recommended annual ophthalmology exams for early detection of glaucoma and other disorders of the eye. Is the patient up to date with their annual eye exam?  Yes  Who is  the provider or what is the name of the office in which the patient attends annual eye exams? Dr.Groat  If pt is not established with a provider, would they like to be referred to a provider to  establish care? No .   Dental Screening: Recommended annual dental exams for proper oral hygiene  Community Resource Referral / Chronic Care Management: CRR required this visit?  No   CCM required this visit?  No      Plan:     I have personally reviewed and noted the following in the patient's chart:   Medical and social history Use of alcohol, tobacco or illicit drugs  Current medications and supplements including opioid prescriptions. Patient is not currently taking opioid prescriptions. Functional ability and status Nutritional status Physical activity Advanced directives List of other physicians Hospitalizations, surgeries, and ER visits in previous 12 months Vitals Screenings to include cognitive, depression, and falls Referrals and appointments  In addition, I have reviewed and discussed with patient certain preventive protocols, quality metrics, and best practice recommendations. A written personalized care plan for preventive services as well as general preventive health recommendations were provided to patient.     Lorrene Reid, LPN   1/61/0960   Nurse Notes: none

## 2022-10-20 ENCOUNTER — Encounter: Payer: Self-pay | Admitting: Family Medicine

## 2022-11-14 ENCOUNTER — Other Ambulatory Visit: Payer: Self-pay | Admitting: Occupational Therapy

## 2022-11-14 DIAGNOSIS — Z0000 Encounter for general adult medical examination without abnormal findings: Secondary | ICD-10-CM

## 2022-11-17 ENCOUNTER — Ambulatory Visit
Admission: RE | Admit: 2022-11-17 | Discharge: 2022-11-17 | Disposition: A | Discharge status: Home / Self Care | Payer: Medicare Other | Source: Ambulatory Visit | Attending: Occupational Therapy | Admitting: Occupational Therapy

## 2022-11-17 DIAGNOSIS — Z1231 Encounter for screening mammogram for malignant neoplasm of breast: Secondary | ICD-10-CM | POA: Diagnosis not present

## 2022-11-17 DIAGNOSIS — Z0000 Encounter for general adult medical examination without abnormal findings: Secondary | ICD-10-CM

## 2022-12-01 ENCOUNTER — Other Ambulatory Visit: Payer: Self-pay | Admitting: Family Medicine

## 2022-12-01 DIAGNOSIS — F419 Anxiety disorder, unspecified: Secondary | ICD-10-CM

## 2022-12-01 DIAGNOSIS — F988 Other specified behavioral and emotional disorders with onset usually occurring in childhood and adolescence: Secondary | ICD-10-CM

## 2023-01-28 IMAGING — CT CT HEAD W/O CM
3 of 4 series · 16 of 47 positions shown, 19 images · non-contrast
Comparison: None Available.

CLINICAL DATA: Syncopal episode today. Fell down stairs. Hit head.
Loss of consciousness.



[Series 3: head w o · axial · 0.42mm/px · z∈[+24,+159]mm · 10 of 33 slices shown, 13 images]
[im 3/33  brain]
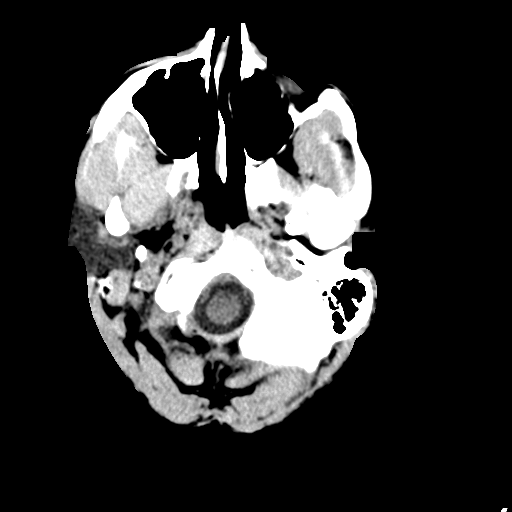
[im 3/33  bone]
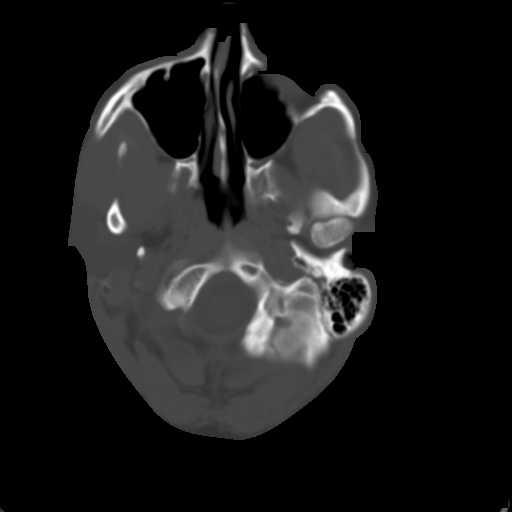
[im 5/33  brain]
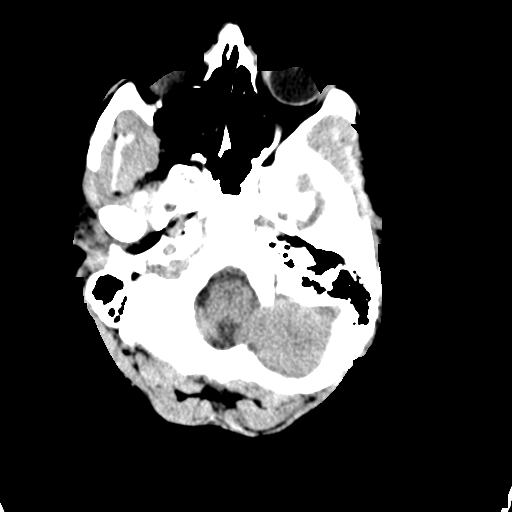
[im 10/33  brain]
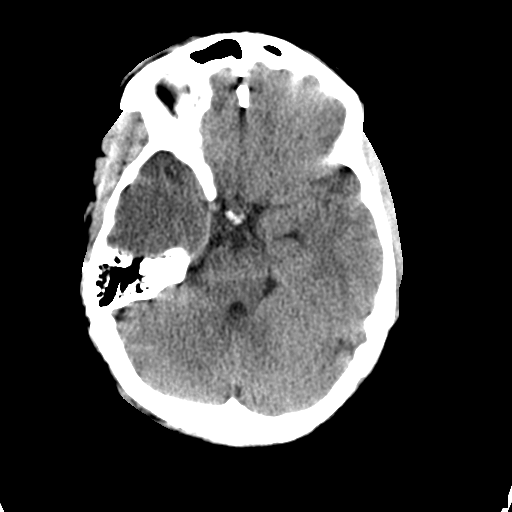
[im 12/33  brain]
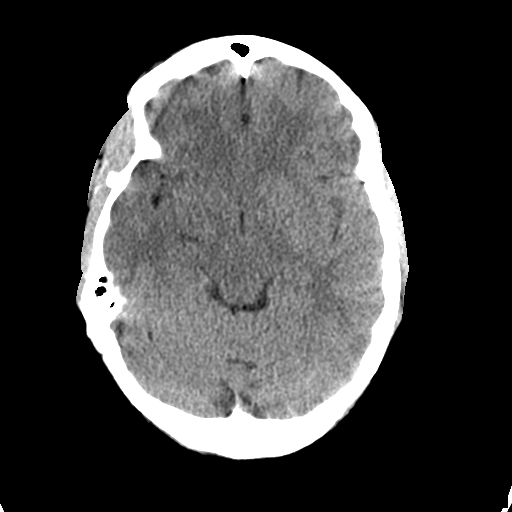
[im 14/33  brain]
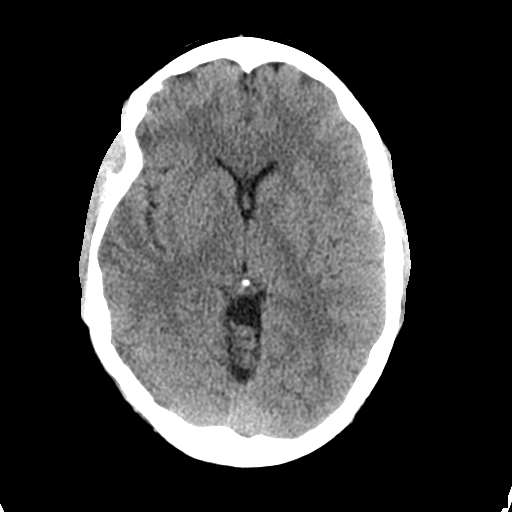
[im 14/33  bone]
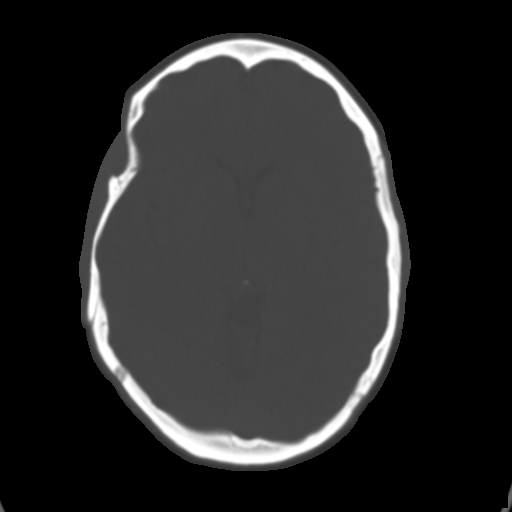
[im 19/33  brain]
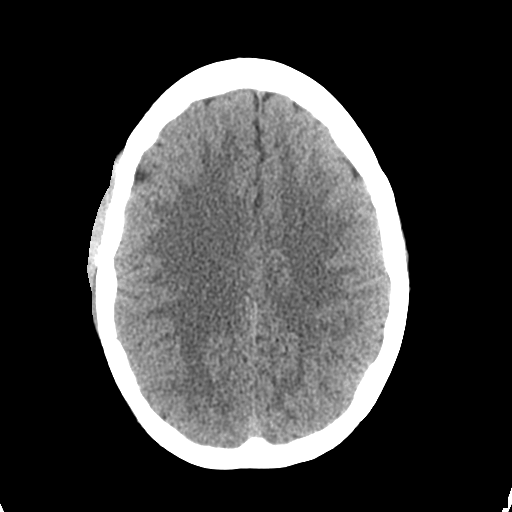
[im 21/33  brain]
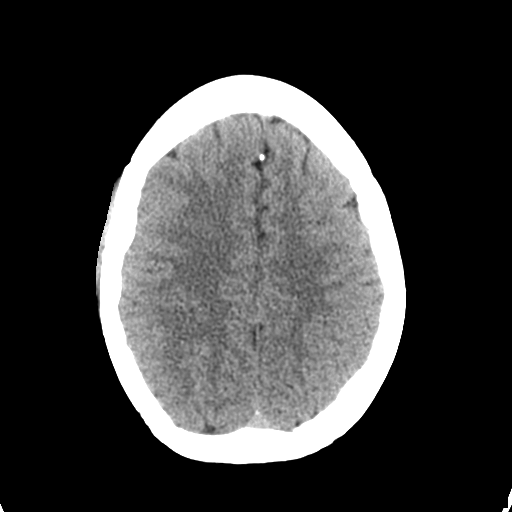
[im 23/33  brain]
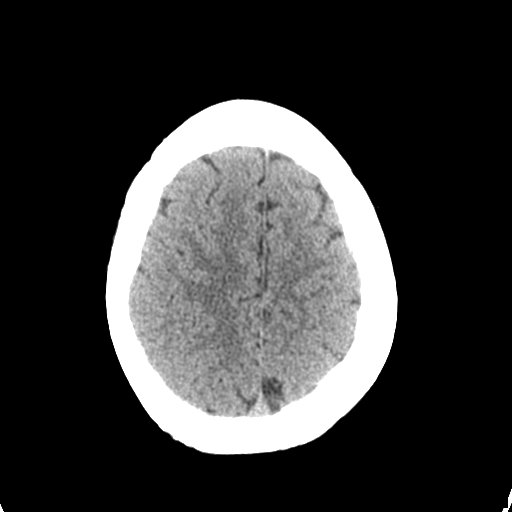
[im 28/33  brain]
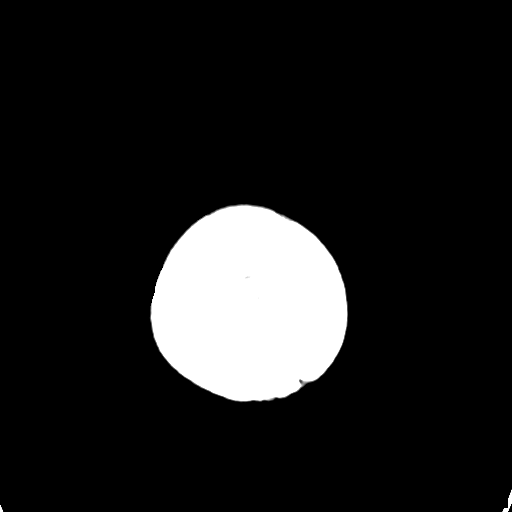
[im 28/33  bone]
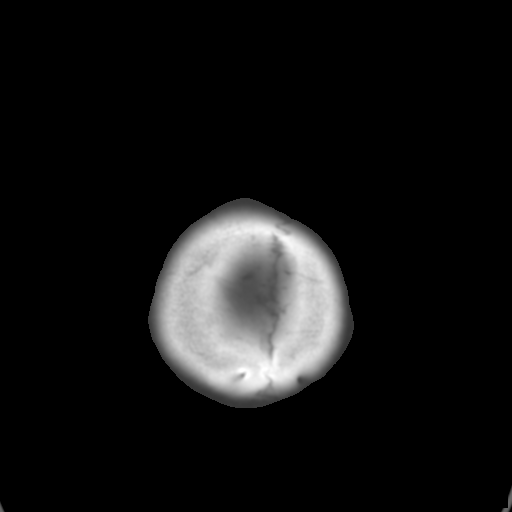
[im 30/33  brain]
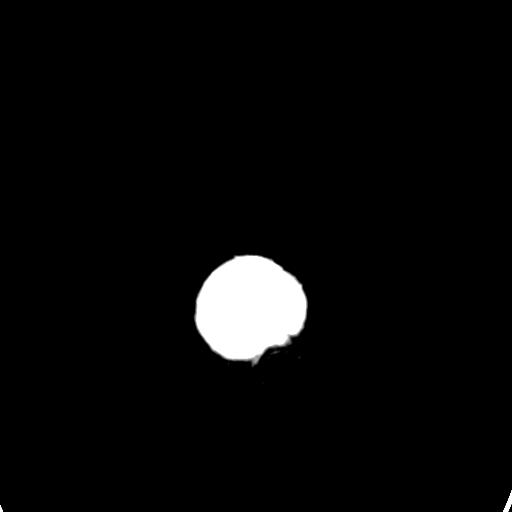

[Series 4: coronal soft · coronal · 0.32mm/px · 3 of 68 slices shown]
[im 23/68  brain]
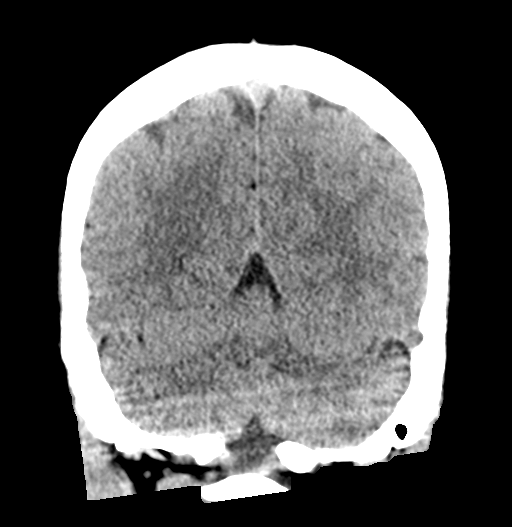
[im 30/68  brain]
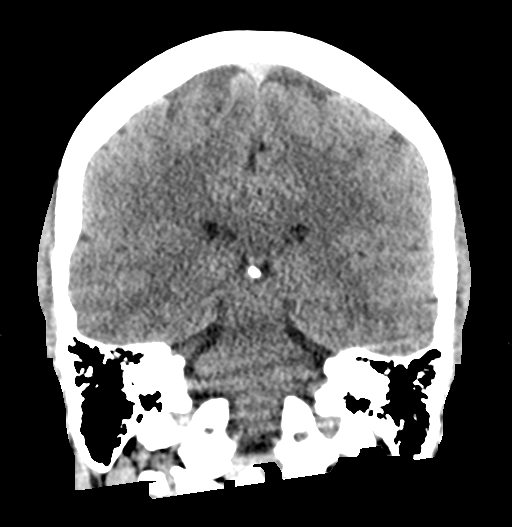
[im 38/68  brain]
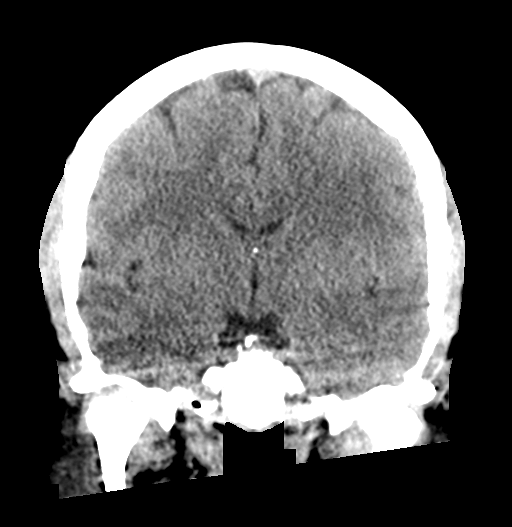

[Series 5: sagittal soft · sagittal · 0.34mm/px · 3 of 56 slices shown]
[im 20/56  brain]
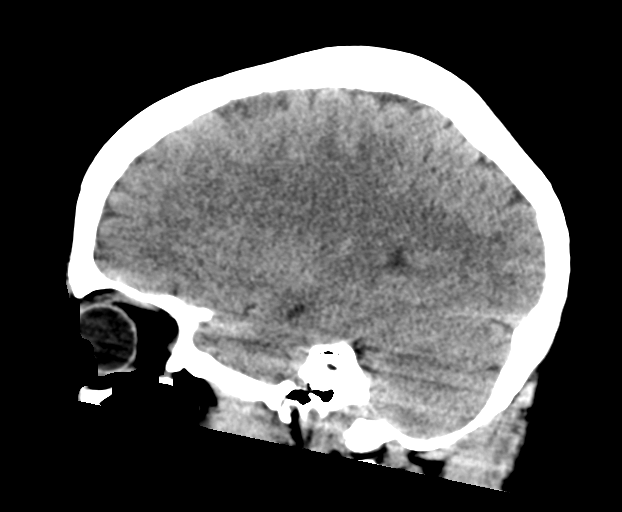
[im 28/56  brain]
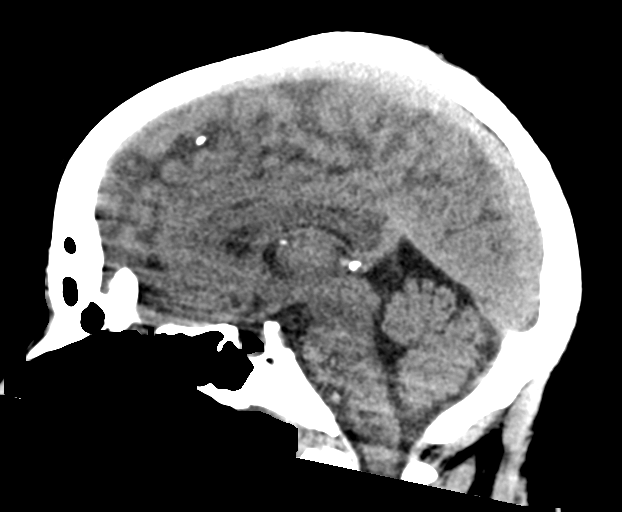
[im 36/56  brain]
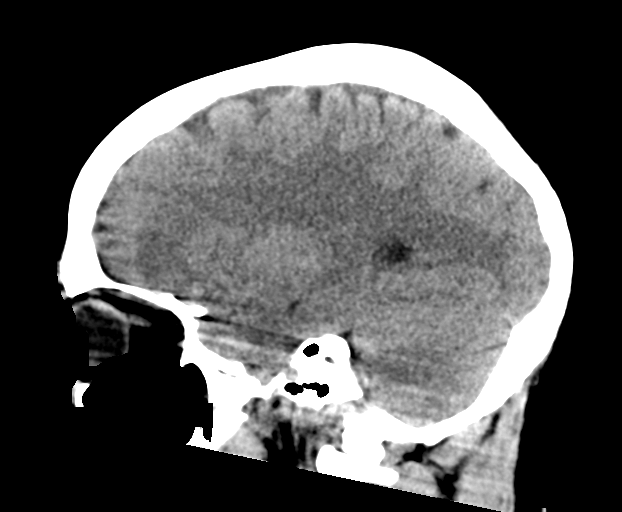

[16 of 47 positions shown; findings below may reference images not displayed]

FINDINGS: Brain: No evidence of intracranial hemorrhage, acute infarction,
hydrocephalus, extra-axial collection, or mass lesion/mass effect.

Vascular:  No hyperdense vessel or other acute findings.

Skull: No evidence of fracture or other significant bone
abnormality.

Sinuses/Orbits:  No acute findings.

Other: None.
IMPRESSION: Negative noncontrast head CT.

## 2023-01-28 IMAGING — DX DG CHEST 2V
2 series · 2 of 2 positions shown · non-contrast
Comparison: None Available.

CLINICAL DATA: Near syncopal episode today.  Dizziness.

EXAM:
CHEST - 2 VIEW

[chest pa]
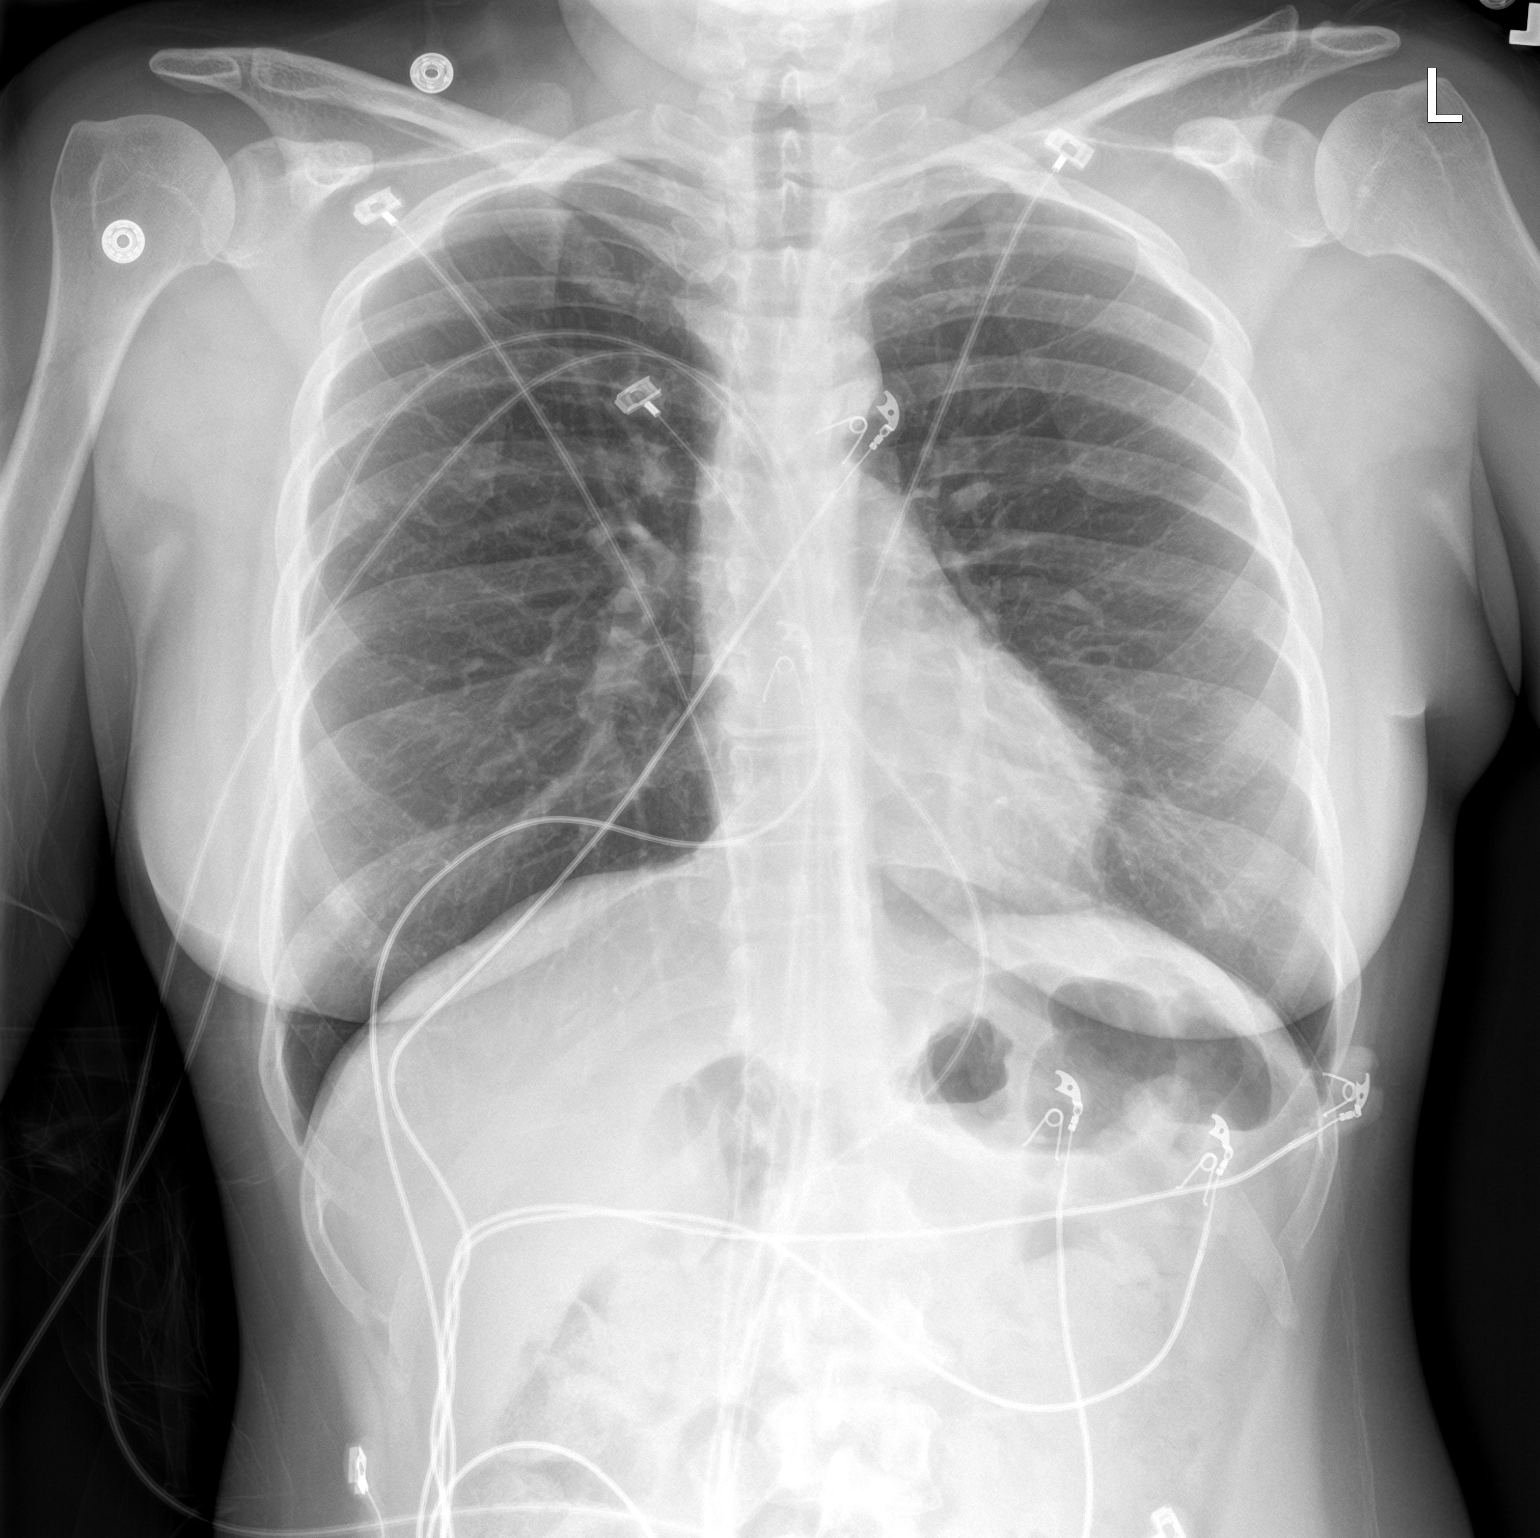

[chest lat]
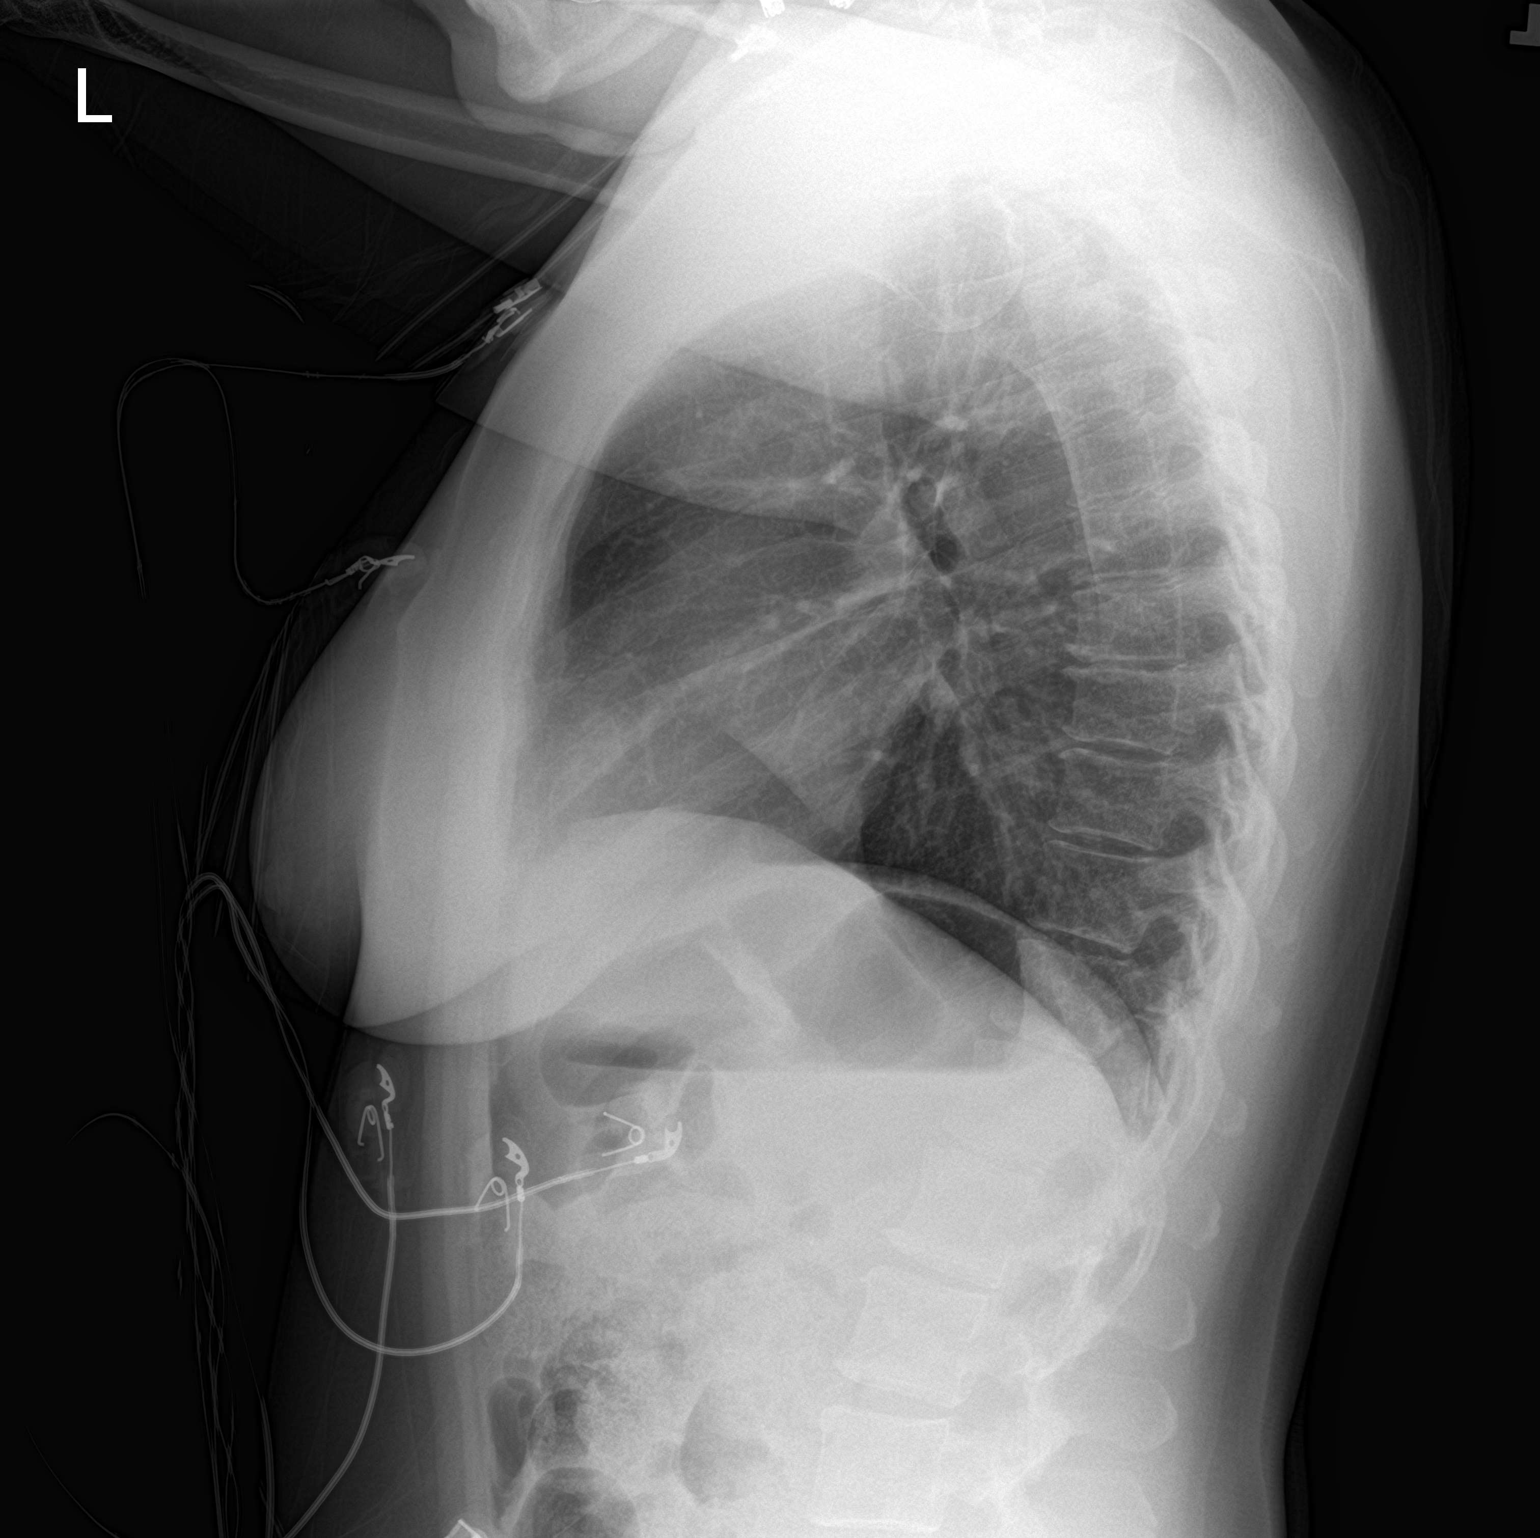

[2 of 2 positions shown; findings below may reference images not displayed]

FINDINGS: The heart size and mediastinal contours are within normal limits.
Both lungs are clear. The visualized skeletal structures are
unremarkable.
IMPRESSION: No active cardiopulmonary disease.

## 2023-03-16 ENCOUNTER — Ambulatory Visit (INDEPENDENT_AMBULATORY_CARE_PROVIDER_SITE_OTHER): Payer: Medicare Other | Admitting: Family

## 2023-03-16 ENCOUNTER — Encounter: Payer: Self-pay | Admitting: Family

## 2023-03-16 VITALS — BP 137/90 | HR 111 | Temp 97.9°F | Ht 60.0 in | Wt 146.4 lb

## 2023-03-16 DIAGNOSIS — J209 Acute bronchitis, unspecified: Secondary | ICD-10-CM | POA: Diagnosis not present

## 2023-03-16 DIAGNOSIS — R051 Acute cough: Secondary | ICD-10-CM | POA: Diagnosis not present

## 2023-03-16 LAB — VERITOR FLU A/B WAIVED
Influenza A: NEGATIVE
Influenza B: NEGATIVE

## 2023-03-16 MED ORDER — FLUTICASONE PROPIONATE 50 MCG/ACT NA SUSP: 2.0000 | Freq: Every day | NASAL | 6 refills | Status: DC

## 2023-03-16 MED ORDER — BENZONATATE 200 MG PO CAPS: 200.0000 mg | ORAL_CAPSULE | Freq: Three times a day (TID) | ORAL | 1 refills | Status: DC | PRN

## 2023-03-16 MED ORDER — CETIRIZINE HCL 10 MG PO TABS: 10.0000 mg | ORAL_TABLET | Freq: Every day | ORAL | 1 refills | Status: DC

## 2023-03-16 NOTE — Progress Notes (Signed)
.  Subjective:    Patient ID: Kayla Shepherd, female    DOB: 1980-09-30, 42 y.o.   MRN: [id number]  Chief Complaint  Patient presents with   Cough   Nasal Congestion    Started Sunday    Wheezing   Pt presents to the office today with cough and congestion that started yesterday.  Cough This is a new problem. The current episode started yesterday. The problem has been unchanged. The cough is Non-productive. Associated symptoms include nasal congestion, postnasal drip and wheezing. Pertinent negatives include no chills, ear pain, fever, headaches, myalgias, sore throat or shortness of breath. She has tried rest and OTC cough suppressant for the symptoms. The treatment provided mild relief. There is no history of asthma, emphysema or pneumonia.  Wheezing  Associated symptoms include coughing. Pertinent negatives include no chills, ear pain, fever, headaches, shortness of breath or sore throat. There is no history of asthma or pneumonia.      Review of Systems  Constitutional:  Negative for chills and fever.  HENT:  Positive for postnasal drip. Negative for ear pain and sore throat.   Respiratory:  Positive for cough and wheezing. Negative for shortness of breath.   Musculoskeletal:  Negative for myalgias.  Neurological:  Negative for headaches.  All other systems reviewed and are negative.      Objective:   Physical Exam Vitals reviewed.  Constitutional:      General: She is not in acute distress.    Appearance: She is well-developed.  HENT:     Head: Normocephalic and atraumatic.  Eyes:     Pupils: Pupils are equal, round, and reactive to light.  Neck:     Thyroid: No thyromegaly.  Cardiovascular:     Rate and Rhythm: Normal rate and regular rhythm.     Heart sounds: Normal heart sounds. No murmur heard. Pulmonary:     Effort: Pulmonary effort is normal. No respiratory distress.     Breath sounds: Normal breath sounds. No wheezing.  Abdominal:     General: Bowel sounds  are normal. There is no distension.     Palpations: Abdomen is soft.     Tenderness: There is no abdominal tenderness.     Comments: Coarse nonproductive cough  Musculoskeletal:        General: No tenderness. Normal range of motion.     Cervical back: Normal range of motion and neck supple.  Skin:    General: Skin is warm and dry.  Neurological:     Mental Status: She is alert and oriented to person, place, and time.     Cranial Nerves: No cranial nerve deficit.     Deep Tendon Reflexes: Reflexes are normal and symmetric.  Psychiatric:        Behavior: Behavior normal.        Thought Content: Thought content normal.        Judgment: Judgment normal.    BP (!) 137/90   Pulse (!) 111   Temp 97.9 F (36.6 C) (Temporal)   Ht 5' (1.524 m)   Wt 146 lb 6.4 oz (66.4 kg)   SpO2 100%   BMI 28.59 kg/m       Assessment & Plan:  Kayla Shepherd comes in today with chief complaint of Cough, Nasal Congestion (Started Sunday ), and Wheezing   Diagnosis and orders addressed:  1. Acute cough - Novel Coronavirus, NAA ([facility]) - Veritor Flu A/B Waived  2. Acute bronchitis, unspecified organism - Take meds  as prescribed - Use a cool mist humidifier  -Use saline nose sprays frequently -Force fluids -For any cough or congestion  Use plain Mucinex- regular strength or max strength is fine -For fever or aces or pains- take tylenol or ibuprofen. -Throat lozenges if help -Follow up if symptoms worsen or do not improve  - fluticasone (FLONASE) 50 MCG/ACT nasal spray; Place 2 sprays into both nostrils daily.  Dispense: 16 g; Refill: 6 - cetirizine (ZYRTEC ALLERGY) 10 MG tablet; Take 1 tablet (10 mg total) by mouth daily.  Dispense: 90 tablet; Refill: 1 - benzonatate (TESSALON) 200 MG capsule; Take 1 capsule (200 mg total) by mouth 3 (three) times daily as needed.  Dispense: 30 capsule; Refill: 1  Jannifer Rodney, FNP

## 2023-03-16 NOTE — Patient Instructions (Signed)

## 2023-03-17 LAB — NOVEL CORONAVIRUS, NAA: SARS-CoV-2, NAA: NOT DETECTED

## 2023-05-08 ENCOUNTER — Ambulatory Visit (INDEPENDENT_AMBULATORY_CARE_PROVIDER_SITE_OTHER): Payer: Medicare Other

## 2023-05-08 DIAGNOSIS — Z23 Encounter for immunization: Secondary | ICD-10-CM

## 2023-07-25 ENCOUNTER — Other Ambulatory Visit: Payer: Self-pay | Admitting: Family Medicine

## 2023-07-25 DIAGNOSIS — F988 Other specified behavioral and emotional disorders with onset usually occurring in childhood and adolescence: Secondary | ICD-10-CM

## 2023-07-25 DIAGNOSIS — F419 Anxiety disorder, unspecified: Secondary | ICD-10-CM

## 2023-07-27 NOTE — Telephone Encounter (Signed)
 Pt hasn't been seen by PCP in over 1 year, Refills denied ntbs with Dr Reece Agar for refills.

## 2023-08-03 ENCOUNTER — Telehealth: Payer: Self-pay | Admitting: Family Medicine

## 2023-08-03 ENCOUNTER — Ambulatory Visit: Admitting: Family

## 2023-08-03 NOTE — Telephone Encounter (Signed)
 No apt time is open at this time will call and follow up if something opens up

## 2023-08-03 NOTE — Telephone Encounter (Signed)
 Copied from CRM [id number]. Topic: Appointments - Scheduling Inquiry for Clinic >> Aug 03, 2023  9:34 AM Gaetano Hawthorne wrote: Reason for CRM: Patient is regarding next appointment with Dr. Nadine Counts which on 05/13 for follow up on medication refills - She will not have enough medication until then - Is there any way that they could get a sooner appointment with the Dr. Nadine Counts? >> Aug 03, 2023  2:19 PM Para March D wrote: I put pt on wait list, if anything comes open sooner.

## 2023-09-22 ENCOUNTER — Ambulatory Visit: Admitting: Family Medicine

## 2023-09-29 ENCOUNTER — Encounter: Payer: Self-pay | Admitting: Family Medicine

## 2023-09-29 ENCOUNTER — Ambulatory Visit (INDEPENDENT_AMBULATORY_CARE_PROVIDER_SITE_OTHER): Admitting: Family Medicine

## 2023-09-29 VITALS — BP 132/89 | HR 98 | Temp 98.5°F | Ht 60.0 in | Wt 153.2 lb

## 2023-09-29 DIAGNOSIS — F900 Attention-deficit hyperactivity disorder, predominantly inattentive type: Secondary | ICD-10-CM | POA: Diagnosis not present

## 2023-09-29 DIAGNOSIS — Z79899 Other long term (current) drug therapy: Secondary | ICD-10-CM | POA: Diagnosis not present

## 2023-09-29 DIAGNOSIS — J301 Allergic rhinitis due to pollen: Secondary | ICD-10-CM

## 2023-09-29 DIAGNOSIS — F419 Anxiety disorder, unspecified: Secondary | ICD-10-CM

## 2023-09-29 DIAGNOSIS — Z23 Encounter for immunization: Secondary | ICD-10-CM

## 2023-09-29 MED ORDER — BUPROPION HCL ER (XL) 300 MG PO TB24: 300.0000 mg | ORAL_TABLET | Freq: Every day | ORAL | 3 refills | Status: DC

## 2023-09-29 MED ORDER — ESCITALOPRAM OXALATE 10 MG PO TABS: 10.0000 mg | ORAL_TABLET | Freq: Every day | ORAL | 3 refills | Status: DC

## 2023-09-29 MED ORDER — FLUTICASONE PROPIONATE 50 MCG/ACT NA SUSP
2.0000 | Freq: Every day | NASAL | 6 refills | Status: AC
Start: 2023-09-29 — End: ?

## 2023-09-29 MED ORDER — CETIRIZINE HCL 10 MG PO TABS: 10.0000 mg | ORAL_TABLET | Freq: Every day | ORAL | 3 refills | Status: DC

## 2023-09-29 MED ORDER — LORAZEPAM 0.5 MG PO TABS: 0.5000 mg | ORAL_TABLET | Freq: Two times a day (BID) | ORAL | 2 refills | Status: DC | PRN

## 2023-09-29 MED ORDER — ATOMOXETINE HCL 40 MG PO CAPS: 40.0000 mg | ORAL_CAPSULE | Freq: Every day | ORAL | 3 refills | Status: DC

## 2023-09-29 NOTE — Addendum Note (Signed)
 Addended by: Taylinn Brabant L on: 09/29/2023 02:20 PM   Modules accepted: Orders

## 2023-09-29 NOTE — Addendum Note (Signed)
 Addended by: Toree Edling L on: 09/29/2023 02:23 PM   Modules accepted: Orders

## 2023-09-29 NOTE — Progress Notes (Signed)
 Subjective: CC: Establish care, medication refills PCP: Eliodoro Guerin, DO WUJ:[name] is a 43 y.o. female presenting to clinic today for:  1.  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  She has had history of short-term memory loss since that time and has been treated for ADHD in some capacity since she was a child.  She reports that she rarely has panic attacks but when she does she utilizes the Ativan .  Her last refill was back in 2023.  She is compliant with Lexapro , Wellbutrin  and Strattera .  Previously treated with Ritalin  for ADHD but to reduce multiple controlled substances her previous PCP switched her from Ritalin  to Strattera  and this seems to work well for her.  She lost her mother in the last year and that has been hard but she resides with her father who has been supportive.  She relies on family members and friends to get transportation to and from appointments as her father has poor vision.  She attends church weekly.  She feels good overall  2.  Allergy She reports good control of allergies with Flonase  and Zyrtec .  Needs refills   ROS: Per HPI  No Known Allergies Past Medical History:  Diagnosis Date   ADD (attention deficit disorder)    Depression     Current Outpatient Medications:    atomoxetine  (STRATTERA ) 40 MG capsule, Take 1 capsule (40 mg total) by mouth daily., Disp: 90 capsule, Rfl: 3   buPROPion  (WELLBUTRIN  XL) 300 MG 24 hr tablet, Take 1 tablet (300 mg total) by mouth daily., Disp: 90 tablet, Rfl: 3   cetirizine  (ZYRTEC  ALLERGY) 10 MG tablet, Take 1 tablet (10 mg total) by mouth daily., Disp: 90 tablet, Rfl: 3   escitalopram  (LEXAPRO ) 10 MG tablet, Take 1 tablet (10 mg total) by mouth daily., Disp: 90 tablet, Rfl: 3   fluticasone  (FLONASE ) 50 MCG/ACT nasal spray, Place 2 sprays into both nostrils daily., Disp: 16 g, Rfl: 6   LORazepam  (ATIVAN ) 0.5 MG tablet, Take 1 tablet (0.5 mg total) by mouth 2 (two) times daily  as needed for anxiety., Disp: 15 tablet, Rfl: 2 Social History   Socioeconomic History   Marital status: Single    Spouse name: Not on file   Number of children: 0   Years of education: Not on file   Highest education level: Not on file  Occupational History   Occupation: disability  Tobacco Use   Smoking status: Never   Smokeless tobacco: Never  Vaping Use   Vaping status: Never Used  Substance and Sexual Activity   Alcohol use: No   Drug use: No   Sexual activity: Not on file  Other Topics Concern   Not on file  Social History Narrative   Lives with her parents   Social Drivers of Health   Financial Resource Strain: Low Risk  (09/19/2022)   Overall Financial Resource Strain (CARDIA)    Difficulty of Paying Living Expenses: Not hard at all  Food Insecurity: No Food Insecurity (09/19/2022)   Hunger Vital Sign    Worried About Running Out of Food in the Last Year: Never true    Ran Out of Food in the Last Year: Never true  Transportation Needs: No Transportation Needs (09/19/2022)   PRAPARE - Administrator, Civil Service (Medical): No    Lack of Transportation (Non-Medical): No  Physical Activity: Insufficiently Active (09/19/2022)   Exercise Vital Sign    Days of  Exercise per Week: 3 days    Minutes of Exercise per Session: 30 min  Stress: No Stress Concern Present (09/19/2022)   [employer] of Occupational Health - Occupational Stress Questionnaire    Feeling of Stress : Not at all  Social Connections: Moderately Isolated (09/19/2022)   Social Connection and Isolation Panel [NHANES]    Frequency of Communication with Friends and Family: More than three times a week    Frequency of Social Gatherings with Friends and Family: More than three times a week    Attends Religious Services: More than 4 times per year    Active Member of [employer] or Organizations: No    Attends Banker Meetings: Never    Marital Status: Never married  Intimate Partner  Violence: Not At Risk (09/19/2022)   Humiliation, Afraid, Rape, and Kick questionnaire    Fear of Current or Ex-Partner: No    Emotionally Abused: No    Physically Abused: No    Sexually Abused: No   Family History  Problem Relation Age of Onset   Hyperlipidemia Father     Objective: Office vital signs reviewed. BP (!) 138/94   Pulse 98   Temp 98.5 F (36.9 C)   Ht 5' (1.524 m)   Wt 153 lb 3.2 oz (69.5 kg)   LMP 09/08/2023   SpO2 99%   BMI 29.92 kg/m   Physical Examination:  General: Awake, alert, well nourished, No acute distress HEENT: Sclera white.  Moist mucous membranes Cardio: regular rate and rhythm, S1S2 heard, no murmurs appreciated Pulm: clear to auscultation bilaterally, no wheezes, rhonchi or rales; normal work of breathing on room air Neuro: Follows commands.  Ambulating independently.  Wears glasses.  Some esotropia noted in the left eye     09/29/2023   11:45 AM 09/19/2022    2:58 PM 09/30/2021    3:02 PM  Depression screen PHQ 2/9  Decreased Interest 0 0 0  Down, Depressed, Hopeless 0 0 0  PHQ - 2 Score 0 0 0  Altered sleeping 0  0  Tired, decreased energy 0  0  Change in appetite 0  0  Feeling bad or failure about yourself  0  0  Trouble concentrating 0  0  Moving slowly or fidgety/restless 0  0  Suicidal thoughts 0  0  PHQ-9 Score 0  0  Difficult doing work/chores Not difficult at all  Not difficult at all      09/29/2023   11:45 AM 12/03/2021    5:24 PM 09/30/2021    3:02 PM 06/04/2021    3:01 PM  GAD 7 : Generalized Anxiety Score  Nervous, Anxious, on Edge 0 0 0 0  Control/stop worrying 0 0 0 0  Worry too much - different things 0 0 0 0  Trouble relaxing 0 0 0 0  Restless 0 0 0 0  Easily annoyed or irritable 0 0 0 0  Afraid - awful might happen 0 0 0 0  Total GAD 7 Score 0 0 0 0  Anxiety Difficulty Not difficult at all Not difficult at all Not difficult at all Not difficult at all    Assessment/ Plan: 43 y.o. female   Attention  deficit hyperactivity disorder (ADHD), predominantly inattentive type - Plan: CMP14+EGFR, CBC, atomoxetine  (STRATTERA ) 40 MG capsule  Anxiety - Plan: CMP14+EGFR, CBC, buPROPion  (WELLBUTRIN  XL) 300 MG 24 hr tablet, escitalopram  (LEXAPRO ) 10 MG tablet, LORazepam  (ATIVAN ) 0.5 MG tablet  Controlled substance agreement signed - Plan: LORazepam  (  ATIVAN ) 0.5 MG tablet  Seasonal allergic rhinitis due to pollen - Plan: cetirizine  (ZYRTEC  ALLERGY) 10 MG tablet, fluticasone  (FLONASE ) 50 MCG/ACT nasal spray  UDS and CSA were updated as per office policy.  I reviewed the national narcotic database which was within normal range.  Very rare use of Ativan .  Will obtain CBC and CMP as these have not been collected in a couple of years.  I have renewed her medications and we will plan to do full physical with fasting labs in 6 to 12 months.  Allergies are well-controlled current regimen and this has been renewed   Eliodoro Guerin, DO [facility] [phone]

## 2023-09-30 ENCOUNTER — Ambulatory Visit: Payer: Self-pay | Admitting: Family Medicine

## 2023-09-30 DIAGNOSIS — Z8249 Family history of ischemic heart disease and other diseases of the circulatory system: Secondary | ICD-10-CM

## 2023-09-30 DIAGNOSIS — E7800 Pure hypercholesterolemia, unspecified: Secondary | ICD-10-CM

## 2023-09-30 DIAGNOSIS — D649 Anemia, unspecified: Secondary | ICD-10-CM

## 2023-09-30 LAB — CBC
Hematocrit: 32.1 % — ABNORMAL LOW (ref 34.0–46.6)
Hemoglobin: 9.9 g/dL — ABNORMAL LOW (ref 11.1–15.9)
MCH: 25.4 pg — ABNORMAL LOW (ref 26.6–33.0)
MCHC: 30.8 g/dL — ABNORMAL LOW (ref 31.5–35.7)
MCV: 83 fL (ref 79–97)
Platelets: 471 10*3/uL — ABNORMAL HIGH (ref 150–450)
RBC: 3.89 x10E6/uL (ref 3.77–5.28)
RDW: 14.6 % (ref 11.7–15.4)
WBC: 5.9 10*3/uL (ref 3.4–10.8)

## 2023-09-30 LAB — CMP14+EGFR
ALT: 9 IU/L (ref 0–32)
AST: 19 IU/L (ref 0–40)
Albumin: 4.4 g/dL (ref 3.9–4.9)
Alkaline Phosphatase: 98 IU/L (ref 44–121)
BUN/Creatinine Ratio: 8 — ABNORMAL LOW (ref 9–23)
BUN: 7 mg/dL (ref 6–24)
Bilirubin Total: <0.2 mg/dL (ref 0.0–1.2)
CO2: 21 mmol/L (ref 20–29)
Calcium: 9.2 mg/dL (ref 8.7–10.2)
Chloride: 105 mmol/L (ref 96–106)
Creatinine, Ser: 0.84 mg/dL (ref 0.57–1.00)
Globulin, Total: 2.8 g/dL (ref 1.5–4.5)
Glucose: 94 mg/dL (ref 70–99)
Potassium: 4.5 mmol/L (ref 3.5–5.2)
Sodium: 142 mmol/L (ref 134–144)
Total Protein: 7.2 g/dL (ref 6.0–8.5)
eGFR: 89 mL/min/{1.73_m2} (ref >59)

## 2023-10-01 LAB — TOXASSURE SELECT 13 (MW), URINE

## 2023-10-14 ENCOUNTER — Other Ambulatory Visit

## 2023-10-14 DIAGNOSIS — Z8249 Family history of ischemic heart disease and other diseases of the circulatory system: Secondary | ICD-10-CM

## 2023-10-14 DIAGNOSIS — D649 Anemia, unspecified: Secondary | ICD-10-CM | POA: Diagnosis not present

## 2023-10-14 DIAGNOSIS — E7800 Pure hypercholesterolemia, unspecified: Secondary | ICD-10-CM

## 2023-10-15 LAB — LIPID PANEL
Chol/HDL Ratio: 4.6 ratio — ABNORMAL HIGH (ref 0.0–4.4)
Cholesterol, Total: 178 mg/dL (ref 100–199)
HDL: 39 mg/dL — ABNORMAL LOW (ref >39)
LDL Chol Calc (NIH): 112 mg/dL — ABNORMAL HIGH (ref 0–99)
Triglycerides: 150 mg/dL — ABNORMAL HIGH (ref 0–149)
VLDL Cholesterol Cal: 27 mg/dL (ref 5–40)

## 2023-10-15 LAB — ANEMIA PROFILE B
Basophils Absolute: 0.1 10*3/uL (ref 0.0–0.2)
Basos: 1 %
EOS (ABSOLUTE): 0.1 10*3/uL (ref 0.0–0.4)
Eos: 1 %
Ferritin: 5 ng/mL — ABNORMAL LOW (ref 15–150)
Folate: 7.2 ng/mL (ref >3.0)
Hematocrit: 32.1 % — ABNORMAL LOW (ref 34.0–46.6)
Hemoglobin: 9.5 g/dL — ABNORMAL LOW (ref 11.1–15.9)
Immature Grans (Abs): 0 10*3/uL (ref 0.0–0.1)
Immature Granulocytes: 0 %
Iron Saturation: 4 % — CL (ref 15–55)
Iron: 18 ug/dL — ABNORMAL LOW (ref 27–159)
Lymphocytes Absolute: 1.9 10*3/uL (ref 0.7–3.1)
Lymphs: 30 %
MCH: 24.6 pg — ABNORMAL LOW (ref 26.6–33.0)
MCHC: 29.6 g/dL — ABNORMAL LOW (ref 31.5–35.7)
MCV: 83 fL (ref 79–97)
Monocytes Absolute: 0.5 10*3/uL (ref 0.1–0.9)
Monocytes: 7 %
Neutrophils Absolute: 3.8 10*3/uL (ref 1.4–7.0)
Neutrophils: 61 %
Platelets: 460 10*3/uL — ABNORMAL HIGH (ref 150–450)
RBC: 3.86 x10E6/uL (ref 3.77–5.28)
RDW: 15 % (ref 11.7–15.4)
Retic Ct Pct: 1.4 % (ref 0.6–2.6)
Total Iron Binding Capacity: 406 ug/dL (ref 250–450)
UIBC: 388 ug/dL (ref 131–425)
Vitamin B-12: 321 pg/mL (ref 232–1245)
WBC: 6.3 10*3/uL (ref 3.4–10.8)

## 2023-10-15 LAB — TSH: TSH: 1.62 u[IU]/mL (ref 0.450–4.500)

## 2023-10-19 ENCOUNTER — Ambulatory Visit: Payer: Self-pay | Admitting: Family Medicine

## 2023-10-19 DIAGNOSIS — D509 Iron deficiency anemia, unspecified: Secondary | ICD-10-CM

## 2023-10-19 MED ORDER — IRON (FERROUS SULFATE) 325 (65 FE) MG PO TABS: 325.0000 mg | ORAL_TABLET | Freq: Two times a day (BID) | ORAL | 1 refills | Status: DC

## 2023-10-27 ENCOUNTER — Ambulatory Visit (INDEPENDENT_AMBULATORY_CARE_PROVIDER_SITE_OTHER)

## 2023-10-27 VITALS — BP 132/89 | HR 89 | Ht 61.0 in | Wt 153.0 lb

## 2023-10-27 DIAGNOSIS — Z0000 Encounter for general adult medical examination without abnormal findings: Secondary | ICD-10-CM

## 2023-10-27 NOTE — Progress Notes (Signed)
 Subjective:   Kayla Shepherd is a 43 y.o. who presents for a Medicare Wellness preventive visit.  As a reminder, Annual Wellness Visits don't include a physical exam, and some assessments may be limited, especially if this visit is performed virtually. We may recommend an in-person follow-up visit with your provider if needed.  Visit Complete: Virtual I connected with  Cloyce Darby on 10/27/23 by a audio enabled telemedicine application and verified that I am speaking with the correct person using two identifiers.  Patient Location: Home  Provider Location: Home Office  I discussed the limitations of evaluation and management by telemedicine. The patient expressed understanding and agreed to proceed.  Vital Signs: Because this visit was a virtual/telehealth visit, some criteria may be missing or patient reported. Any vitals not documented were not able to be obtained and vitals that have been documented are patient reported.  VideoDeclined- This patient declined Librarian, academic. Therefore the visit was completed with audio only.  Persons Participating in Visit: Patient.  AWV Questionnaire: No: Patient Medicare AWV questionnaire was not completed prior to this visit.  Cardiac Risk Factors include: advanced age (>23men, >3 women);dyslipidemia     Objective:    Today's Vitals   10/27/23 0956  BP: 132/89  Pulse: 89  Weight: 153 lb (69.4 kg)  Height: 5' 1 (1.549 m)   Body mass index is 28.91 kg/m.     10/27/2023   10:03 AM 09/19/2022    2:59 PM 09/29/2021   11:26 AM  Advanced Directives  Does Patient Have a Medical Advance Directive? Yes No No  Type of Estate agent of [place];Living will    Copy of Healthcare Power of Attorney in Chart? No - copy requested    Would patient like information on creating a medical advance directive?  No - Patient declined     Current Medications (verified) Outpatient Encounter  Medications as of 10/27/2023  Medication Sig   atomoxetine  (STRATTERA ) 40 MG capsule Take 1 capsule (40 mg total) by mouth daily.   buPROPion  (WELLBUTRIN  XL) 300 MG 24 hr tablet Take 1 tablet (300 mg total) by mouth daily.   cetirizine  (ZYRTEC  ALLERGY) 10 MG tablet Take 1 tablet (10 mg total) by mouth daily.   escitalopram  (LEXAPRO ) 10 MG tablet Take 1 tablet (10 mg total) by mouth daily.   fluticasone  (FLONASE ) 50 MCG/ACT nasal spray Place 2 sprays into both nostrils daily.   Iron , Ferrous Sulfate , 325 (65 Fe) MG TABS Take 325 mg by mouth in the morning and at bedtime. With a vitamin C tablet OR small glass of orange juice.   LORazepam  (ATIVAN ) 0.5 MG tablet Take 1 tablet (0.5 mg total) by mouth 2 (two) times daily as needed for anxiety.   No facility-administered encounter medications on file as of 10/27/2023.    Allergies (verified) Patient has no known allergies.   History: Past Medical History:  Diagnosis Date   ADD (attention deficit disorder)    Depression    History reviewed. No pertinent surgical history. Family History  Problem Relation Age of Onset   Hyperlipidemia Father    Social History   Socioeconomic History   Marital status: Single    Spouse name: Not on file   Number of children: 0   Years of education: Not on file   Highest education level: Not on file  Occupational History   Occupation: disability  Tobacco Use   Smoking status: Never   Smokeless tobacco: Never  Vaping Use   Vaping status: Never Used  Substance and Sexual Activity   Alcohol use: No   Drug use: No   Sexual activity: Not on file  Other Topics Concern   Not on file  Social History Narrative   Lives with her parents   Social Drivers of Health   Financial Resource Strain: Low Risk  (10/27/2023)   Overall Financial Resource Strain (CARDIA)    Difficulty of Paying Living Expenses: Not hard at all  Food Insecurity: No Food Insecurity (10/27/2023)   Hunger Vital Sign    Worried About  Running Out of Food in the Last Year: Never true    Ran Out of Food in the Last Year: Never true  Transportation Needs: No Transportation Needs (10/27/2023)   PRAPARE - Administrator, Civil Service (Medical): No    Lack of Transportation (Non-Medical): No  Physical Activity: Sufficiently Active (10/27/2023)   Exercise Vital Sign    Days of Exercise per Week: 7 days    Minutes of Exercise per Session: 120 min  Stress: Stress Concern Present (10/27/2023)   [employer] of Occupational Health - Occupational Stress Questionnaire    Feeling of Stress: Rather much  Social Connections: Moderately Isolated (10/27/2023)   Social Connection and Isolation Panel    Frequency of Communication with Friends and Family: More than three times a week    Frequency of Social Gatherings with Friends and Family: More than three times a week    Attends Religious Services: More than 4 times per year    Active Member of [employer] or Organizations: No    Attends Engineer, structural: Never    Marital Status: Never married    Tobacco Counseling Counseling given: Yes    Clinical Intake:  Pre-visit preparation completed: Yes  Pain : No/denies pain     BMI - recorded: 28.91 Nutritional Status: BMI 25 -29 Overweight Nutritional Risks: None Diabetes: No  No results found for: HGBA1C   How often do you need to have someone help you when you read instructions, pamphlets, or other written materials from your doctor or pharmacy?: 1 - Never  Interpreter Needed?: No  Information entered by :: Alia t/cma   Activities of Daily Living     10/27/2023   10:01 AM  In your present state of health, do you have any difficulty performing the following activities:  Hearing? 0  Vision? 0  Difficulty concentrating or making decisions? 0  Walking or climbing stairs? 0  Dressing or bathing? 0  Doing errands, shopping? 1  Comment pt's dad  Preparing Food and eating ? N  Using the Toilet?  N  In the past six months, have you accidently leaked urine? N  Do you have problems with loss of bowel control? N  Managing your Medications? N  Managing your Finances? N  Housekeeping or managing your Housekeeping? N    Patient Care Team: Eliodoro Guerin, DO as PCP - General (Family Medicine)  I have updated your Care Teams any recent Medical Services you may have received from other providers in the past year.     Assessment:   This is a routine wellness examination for [place].  Hearing/Vision screen Hearing Screening - Comments:: Pt denies hearing dif Vision Screening - Comments:: Pt wear glasses/denies vision dif/pt goes to [facility] in [place] ov 2024   Goals Addressed             This Visit's Progress  Patient Stated       Pt would travel more       Depression Screen     10/27/2023   10:05 AM 09/29/2023   11:45 AM 09/19/2022    2:58 PM 09/30/2021    3:02 PM 06/04/2021    3:00 PM 02/04/2021    9:51 AM 11/29/2020    3:48 PM  PHQ 2/9 Scores  PHQ - 2 Score 0 0 0 0 0 0 0  PHQ- 9 Score 0 0  0 0  0    Fall Risk     10/27/2023    9:59 AM 09/29/2023   11:48 AM 09/29/2023   11:45 AM 09/19/2022    2:57 PM 09/30/2021    3:02 PM  Fall Risk   Falls in the past year? 0 0 0 0 1  Number falls in past yr: 0 0 0  0  Injury with Fall? 0 0 0  0  Risk for fall due to : No Fall Risks No Fall Risks No Fall Risks    Follow up Falls evaluation completed Falls evaluation completed Falls evaluation completed  Falls prevention discussed      Data saved with a previous flowsheet row definition    MEDICARE RISK AT HOME:  Medicare Risk at Home Any stairs in or around the home?: Yes If so, are there any without handrails?: Yes Home free of loose throw rugs in walkways, pet beds, electrical cords, etc?: Yes Adequate lighting in your home to reduce risk of falls?: Yes Life alert?: No Use of a cane, walker or w/c?: No Grab bars in the bathroom?: No Shower chair  or bench in shower?: Yes Elevated toilet seat or a handicapped toilet?: No  TIMED UP AND GO:  Was the test performed?  no  Cognitive Function: 6CIT completed        10/27/2023   10:07 AM 09/19/2022    3:00 PM  6CIT Screen  What Year? 0 points 0 points  What month? 0 points 0 points  What time? 0 points 0 points  Count back from 20 0 points 0 points  Months in reverse 0 points 0 points  Repeat phrase 2 points 0 points  Total Score 2 points 0 points    Immunizations Immunization History  Administered Date(s) Administered   Influenza, Seasonal, Injecte, Preservative Fre 05/08/2023   Influenza,inj,Quad PF,6+ Mos 03/26/2016, 03/18/2017, 03/17/2018, 03/23/2019, 06/01/2020, 03/15/2021, 04/01/2022   Moderna Sars-Covid-2 Vaccination 08/16/2019, 09/13/2019, 05/22/2020   Tdap 09/29/2023    Screening Tests Health Maintenance  Topic Date Due   Cervical Cancer Screening (HPV/Pap Cotest)  Never done   COVID-19 Vaccine (4 - 2024-25 season) 11/11/2024 (Originally 01/11/2023)   INFLUENZA VACCINE  12/11/2023   Medicare Annual Wellness (AWV)  10/26/2024   DTaP/Tdap/Td (2 - Td or Tdap) 09/28/2033   Hepatitis C Screening  Completed   HIV Screening  Completed   HPV VACCINES  Aged Out   Meningococcal B Vaccine  Aged Out    Health Maintenance  Health Maintenance Due  Topic Date Due   Cervical Cancer Screening (HPV/Pap Cotest)  Never done   Health Maintenance Items Addressed: See Nurse Notes at the end of this note  Additional Screening:  Vision Screening: Recommended annual ophthalmology exams for early detection of glaucoma and other disorders of the eye. Would you like a referral to an eye doctor? No    Dental Screening: Recommended annual dental exams for proper oral hygiene  Community Resource Referral / Chronic Care  Management: CRR required this visit?  No   CCM required this visit?  No   Plan:    I have personally reviewed and noted the following in the patient's  chart:   Medical and social history Use of alcohol, tobacco or illicit drugs  Current medications and supplements including opioid prescriptions. Patient is not currently taking opioid prescriptions. Functional ability and status Nutritional status Physical activity Advanced directives List of other physicians Hospitalizations, surgeries, and ER visits in previous 12 months Vitals Screenings to include cognitive, depression, and falls Referrals and appointments  In addition, I have reviewed and discussed with patient certain preventive protocols, quality metrics, and best practice recommendations. A written personalized care plan for preventive services as well as general preventive health recommendations were provided to patient.   Michaelle Adolphus, CMA   10/27/2023   After Visit Summary: (MyChart) Due to this being a telephonic visit, the after visit summary with patients personalized plan was offered to patient via MyChart   Notes: Pt is aware and due for the following and will get it done at next ov w/pcp: Pap

## 2023-10-27 NOTE — Patient Instructions (Signed)
 Ms. Kayla Shepherd , Thank you for taking time out of your busy schedule to complete your Annual Wellness Visit with me. I enjoyed our conversation and look forward to speaking with you again next year. I, as well as your care team,  appreciate your ongoing commitment to your health goals. Please review the following plan we discussed and let me know if I can assist you in the future. Your Game plan/ To Do List    Follow up Visits: Next Medicare AWV with our clinical staff: 10/27/24 at 10:00a.m.   Next Office Visit with your provider: 04/18/24 at 8:00a.m.  Clinician Recommendations:  Aim for 30 minutes of exercise or brisk walking, 6-8 glasses of water, and 5 servings of fruits and vegetables each day. N/a      This is a list of the screening recommended for you and due dates:  Health Maintenance  Topic Date Due   Pap with HPV screening  Never done   COVID-19 Vaccine (4 - 2024-25 season) 11/11/2024*   Flu Shot  12/11/2023   Medicare Annual Wellness Visit  10/26/2024   DTaP/Tdap/Td vaccine (2 - Td or Tdap) 09/28/2033   Hepatitis C Screening  Completed   HIV Screening  Completed   HPV Vaccine  Aged Out   Meningitis B Vaccine  Aged Out  *Topic was postponed. The date shown is not the original due date.    Advanced directives: (Copy Requested) Please bring a copy of your health care power of attorney and living will to the office to be added to your chart at your convenience. You can mail to [facility] [address] [place], [place] [address] or email to [email or web address] Advance Care Planning is important because it:  [x]  Makes sure you receive the medical care that is consistent with your values, goals, and preferences  [x]  It provides guidance to your family and loved ones and reduces their decisional burden about whether or not they are making the right decisions based on your wishes.  Follow the link provided in your after visit summary or read over the paperwork  we have mailed to you to help you started getting your Advance Directives in place. If you need assistance in completing these, please reach out to us  so that we can help you!  See attachments for Preventive Care and Fall Prevention Tips.

## 2024-01-19 ENCOUNTER — Other Ambulatory Visit

## 2024-01-21 ENCOUNTER — Other Ambulatory Visit

## 2024-04-04 ENCOUNTER — Ambulatory Visit: Admitting: Family Medicine

## 2024-04-04 ENCOUNTER — Encounter: Payer: Self-pay | Admitting: Family Medicine

## 2024-04-04 VITALS — BP 134/88 | HR 113 | Temp 97.2°F | Wt 152.0 lb

## 2024-04-04 DIAGNOSIS — B9689 Other specified bacterial agents as the cause of diseases classified elsewhere: Secondary | ICD-10-CM | POA: Diagnosis not present

## 2024-04-04 DIAGNOSIS — J208 Acute bronchitis due to other specified organisms: Secondary | ICD-10-CM | POA: Diagnosis not present

## 2024-04-04 MED ORDER — AZITHROMYCIN 250 MG PO TABS: ORAL_TABLET | ORAL | 0 refills | Status: DC

## 2024-04-04 MED ORDER — PROMETHAZINE-DM 6.25-15 MG/5ML PO SYRP: 5.0000 mL | ORAL_SOLUTION | Freq: Four times a day (QID) | ORAL | 0 refills | Status: DC | PRN

## 2024-04-04 NOTE — Progress Notes (Signed)
 Subjective:  Patient ID: Kayla Shepherd, female    DOB: Sep 30, 1980  Age: 43 y.o. MRN: [id number]  CC: sick (Started last night. Coughing and sneezing. Some clear drainage. Taking OTC allergy pills and cough syrup. )   HPI  Discussed the use of AI scribe software for clinical note transcription with the patient, who gave verbal consent to proceed.  History of Present Illness Kayla Shepherd is a 43 year old female who presents with cough and runny nose.  Symptoms began on Sunday, April 03, 2024, with sneezing, followed by the development of a cough and runny nose by Monday morning. The nasal discharge is clear and watery. No sputum production, earaches, sore throat, sinus pain, fever, or shortness of breath. She feels run down due to her symptoms.  She has a history of bronchitis and is concerned about her symptoms progressing to bronchitis, as has happened in the past. She is concerned about her symptoms progressing to bronchitis, as has happened in the past.          11 /24/2025   11:38 AM 10/27/2023   10:05 AM 09/29/2023   11:45 AM  Depression screen PHQ 2/9  Decreased Interest 0 0 0  Down, Depressed, Hopeless 0 0 0  PHQ - 2 Score 0 0 0  Altered sleeping 0 0 0  Tired, decreased energy 0 0 0  Change in appetite 0 0 0  Feeling bad or failure about yourself  0 0 0  Trouble concentrating 0 0 0  Moving slowly or fidgety/restless 0 0 0  Suicidal thoughts 0 0 0  PHQ-9 Score 0 0  0   Difficult doing work/chores Not difficult at all Not difficult at all Not difficult at all     Data saved with a previous flowsheet row definition    History Kayla Shepherd has a past medical history of ADD (attention deficit disorder) and Depression.   She has no past surgical history on file.   Her family history includes Hyperlipidemia in her father.She reports that she has never smoked. She has never used smokeless tobacco. She reports that she does not drink alcohol and does not use  drugs.    ROS Review of Systems  Constitutional:  Negative for activity change, appetite change, chills and fever.  HENT:  Positive for congestion, postnasal drip and rhinorrhea. Negative for ear discharge, ear pain, sinus pressure, sneezing and trouble swallowing.   Respiratory:  Negative for chest tightness and shortness of breath.   Skin:  Negative for rash.    Objective:  BP 134/88   Pulse (!) 113   Temp (!) 97.2 F (36.2 C)   Wt 152 lb (68.9 kg)   SpO2 97%   BMI 28.72 kg/m   BP Readings from Last 3 Encounters:  04/04/24 134/88  10/27/23 132/89  09/29/23 132/89    Wt Readings from Last 3 Encounters:  04/04/24 152 lb (68.9 kg)  10/27/23 153 lb (69.4 kg)  09/29/23 153 lb 3.2 oz (69.5 kg)     Physical Exam Physical Exam GENERAL: Alert, cooperative, well developed, no acute distress HEENT: Normocephalic, swollen throat especially on the right, nasal passages show evidence of bronchitis, moist mucous membranes CHEST: Evidence of bronchitis beginning to form,mild bronchitic character bilaterally, no wheezes, rhonchi, or crackles CARDIOVASCULAR: Normal heart rate and rhythm, S1 and S2 normal without murmurs ABDOMEN: Soft, non-tender, non-distended, without organomegaly, normal bowel sounds EXTREMITIES: No cyanosis or edema NEUROLOGICAL: Cranial nerves grossly intact, moves all extremities without gross motor  or sensory deficit   Assessment & Plan:  Acute bacterial bronchitis -     Azithromycin ; Take two right away Then one a day for the next 4 days.  Dispense: 6 each; Refill: 0 -     Promethazine -DM; Take 5 mLs by mouth 4 (four) times daily as needed for cough.  Dispense: 240 mL; Refill: 0    Assessment and Plan Assessment & Plan Acute upper respiratory infection with evolving acute bronchitis   She presents with an acute upper respiratory infection, experiencing cough and rhinorrhea since yesterday. There is no fever, sore throat, or sinus pain. Examination shows  nasal passage swelling, particularly on the right, and lung findings suggest early bronchitis. She has no asthma history, but there is concern for progression to bronchitis based on past experiences. Prescribe azithromycin  (Z-Pak) for evolving bronchitis and cough syrup for symptomatic relief. Prescriptions sent to [facility].       Follow-up: No follow-ups on file.  Butler Der, M.D.

## 2024-04-12 ENCOUNTER — Ambulatory Visit: Admitting: Family

## 2024-04-12 ENCOUNTER — Encounter: Payer: Self-pay | Admitting: Family

## 2024-04-12 VITALS — BP 137/92 | HR 95 | Temp 98.2°F | Ht 61.0 in | Wt 153.6 lb

## 2024-04-12 DIAGNOSIS — J209 Acute bronchitis, unspecified: Secondary | ICD-10-CM | POA: Diagnosis not present

## 2024-04-12 MED ORDER — PREDNISONE 10 MG (21) PO TBPK: ORAL_TABLET | ORAL | 0 refills | Status: DC

## 2024-04-12 MED ORDER — ALBUTEROL SULFATE HFA 108 (90 BASE) MCG/ACT IN AERS: 2.0000 | INHALATION_SPRAY | Freq: Four times a day (QID) | RESPIRATORY_TRACT | 0 refills | Status: AC | PRN

## 2024-04-12 MED ORDER — PROMETHAZINE-DM 6.25-15 MG/5ML PO SYRP: 5.0000 mL | ORAL_SOLUTION | Freq: Four times a day (QID) | ORAL | 0 refills | Status: DC | PRN

## 2024-04-12 MED ORDER — BENZONATATE 200 MG PO CAPS: 200.0000 mg | ORAL_CAPSULE | Freq: Two times a day (BID) | ORAL | 0 refills | Status: DC | PRN

## 2024-04-12 NOTE — Progress Notes (Signed)
 Subjective:    Patient ID: Kayla Shepherd, female    DOB: March 22, 1981, 43 y.o.   MRN: [id number]  Chief Complaint  Patient presents with   Bronchitis   Pt presents to the office today with recurrent cough. Was seen on 04/04/24 and diagnosed with bacterial bronchitis. She was given Zpak and promethazine -DM cough syrup. She was feeling better, but after completing reports her cough has returned.  Cough This is a new problem. The current episode started 1 to 4 weeks ago. The problem has been waxing and waning. The problem occurs every few minutes. The cough is Non-productive. Associated symptoms include wheezing. Pertinent negatives include no chills, ear congestion, ear pain, fever, headaches, myalgias, nasal congestion, postnasal drip, sore throat or shortness of breath. She has tried rest (zpak) for the symptoms. The treatment provided mild relief.      Review of Systems  Constitutional:  Negative for chills and fever.  HENT:  Negative for ear pain, postnasal drip and sore throat.   Respiratory:  Positive for cough and wheezing. Negative for shortness of breath.   Musculoskeletal:  Negative for myalgias.  Neurological:  Negative for headaches.  All other systems reviewed and are negative.   Social History   Socioeconomic History   Marital status: Single    Spouse name: Not on file   Number of children: 0   Years of education: Not on file   Highest education level: Not on file  Occupational History   Occupation: disability  Tobacco Use   Smoking status: Never   Smokeless tobacco: Never  Vaping Use   Vaping status: Never Used  Substance and Sexual Activity   Alcohol use: No   Drug use: No   Sexual activity: Not on file  Other Topics Concern   Not on file  Social History Narrative   Lives with her parents   Social Drivers of Health   Financial Resource Strain: Low Risk  (10/27/2023)   Overall Financial Resource Strain (CARDIA)    Difficulty of Paying Living Expenses:  Not hard at all  Food Insecurity: No Food Insecurity (10/27/2023)   Hunger Vital Sign    Worried About Running Out of Food in the Last Year: Never true    Ran Out of Food in the Last Year: Never true  Transportation Needs: No Transportation Needs (10/27/2023)   PRAPARE - Administrator, Civil Service (Medical): No    Lack of Transportation (Non-Medical): No  Physical Activity: Sufficiently Active (10/27/2023)   Exercise Vital Sign    Days of Exercise per Week: 7 days    Minutes of Exercise per Session: 120 min  Stress: Stress Concern Present (10/27/2023)   [employer] of Occupational Health - Occupational Stress Questionnaire    Feeling of Stress: Rather much  Social Connections: Moderately Isolated (10/27/2023)   Social Connection and Isolation Panel    Frequency of Communication with Friends and Family: More than three times a week    Frequency of Social Gatherings with Friends and Family: More than three times a week    Attends Religious Services: More than 4 times per year    Active Member of [employer] or Organizations: No    Attends Banker Meetings: Never    Marital Status: Never married   Family History  Problem Relation Age of Onset   Hyperlipidemia Father         Objective:   Physical Exam Vitals reviewed.  Constitutional:      General:  She is not in acute distress.    Appearance: She is well-developed.  HENT:     Head: Normocephalic and atraumatic.     Right Ear: Tympanic membrane normal.     Left Ear: Tympanic membrane normal.  Eyes:     Pupils: Pupils are equal, round, and reactive to light.  Neck:     Thyroid : No thyromegaly.  Cardiovascular:     Rate and Rhythm: Normal rate and regular rhythm.     Heart sounds: Normal heart sounds. No murmur heard. Pulmonary:     Effort: Pulmonary effort is normal. No respiratory distress.     Breath sounds: Normal breath sounds. No wheezing.     Comments: Dry nonproductive cough Abdominal:      General: Bowel sounds are normal. There is no distension.     Palpations: Abdomen is soft.     Tenderness: There is no abdominal tenderness.  Musculoskeletal:        General: No tenderness. Normal range of motion.     Cervical back: Normal range of motion and neck supple.  Skin:    General: Skin is warm and dry.  Neurological:     Mental Status: She is alert and oriented to person, place, and time.     Cranial Nerves: No cranial nerve deficit.     Deep Tendon Reflexes: Reflexes are normal and symmetric.  Psychiatric:        Behavior: Behavior normal.        Thought Content: Thought content normal.        Judgment: Judgment normal.       BP (!) 137/92   Pulse 95   Temp 98.2 F (36.8 C) (Temporal)   Ht 5' 1 (1.549 m)   Wt 153 lb 9.6 oz (69.7 kg)   SpO2 96%   BMI 29.02 kg/m      Assessment & Plan:  AIYONNA LUCADO comes in today with chief complaint of Bronchitis   Diagnosis and orders addressed:  1. Acute bronchitis, unspecified organism (Primary) - Take meds as prescribed - Use a cool mist humidifier  -Use saline nose sprays frequently -Force fluids -For any cough or congestion  Use plain Mucinex- regular strength or max strength is fine -For fever or aces or pains- take tylenol or ibuprofen. -Throat lozenges if help Follow up if symptoms worsen or do not improve  - predniSONE (STERAPRED UNI-PAK 21 TAB) 10 MG (21) TBPK tablet; Use as directed  Dispense: 21 tablet; Refill: 0 - benzonatate  (TESSALON ) 200 MG capsule; Take 1 capsule (200 mg total) by mouth 2 (two) times daily as needed for cough.  Dispense: 20 capsule; Refill: 0 - promethazine -dextromethorphan (PROMETHAZINE -DM) 6.25-15 MG/5ML syrup; Take 5 mLs by mouth 4 (four) times daily as needed for cough.  Dispense: 240 mL; Refill: 0 - albuterol (VENTOLIN HFA) 108 (90 Base) MCG/ACT inhaler; Inhale 2 puffs into the lungs every 6 (six) hours as needed for wheezing or shortness of breath.  Dispense: 8 g; Refill:  0     Bari Learn, FNP

## 2024-04-12 NOTE — Patient Instructions (Signed)

## 2024-04-13 ENCOUNTER — Other Ambulatory Visit: Payer: Self-pay | Admitting: Family Medicine

## 2024-04-13 DIAGNOSIS — D509 Iron deficiency anemia, unspecified: Secondary | ICD-10-CM

## 2024-04-18 ENCOUNTER — Encounter: Payer: Self-pay | Admitting: Family Medicine

## 2024-04-28 ENCOUNTER — Other Ambulatory Visit: Payer: Self-pay | Admitting: Family Medicine

## 2024-04-28 DIAGNOSIS — Z1231 Encounter for screening mammogram for malignant neoplasm of breast: Secondary | ICD-10-CM

## 2024-05-24 ENCOUNTER — Ambulatory Visit (INDEPENDENT_AMBULATORY_CARE_PROVIDER_SITE_OTHER): Admitting: Family Medicine

## 2024-05-24 ENCOUNTER — Encounter: Payer: Self-pay | Admitting: Family Medicine

## 2024-05-24 ENCOUNTER — Other Ambulatory Visit (HOSPITAL_COMMUNITY)
Admission: RE | Admit: 2024-05-24 | Discharge: 2024-05-24 | Disposition: A | Source: Ambulatory Visit | Attending: Family Medicine | Admitting: Family Medicine

## 2024-05-24 VITALS — BP 147/84 | HR 132 | Temp 97.7°F | Ht 60.5 in | Wt 154.2 lb

## 2024-05-24 DIAGNOSIS — F419 Anxiety disorder, unspecified: Secondary | ICD-10-CM | POA: Diagnosis not present

## 2024-05-24 DIAGNOSIS — E7800 Pure hypercholesterolemia, unspecified: Secondary | ICD-10-CM | POA: Diagnosis not present

## 2024-05-24 DIAGNOSIS — Z0000 Encounter for general adult medical examination without abnormal findings: Secondary | ICD-10-CM

## 2024-05-24 DIAGNOSIS — Z1151 Encounter for screening for human papillomavirus (HPV): Secondary | ICD-10-CM | POA: Diagnosis not present

## 2024-05-24 DIAGNOSIS — F900 Attention-deficit hyperactivity disorder, predominantly inattentive type: Secondary | ICD-10-CM | POA: Diagnosis not present

## 2024-05-24 DIAGNOSIS — Z124 Encounter for screening for malignant neoplasm of cervix: Secondary | ICD-10-CM | POA: Diagnosis not present

## 2024-05-24 DIAGNOSIS — J301 Allergic rhinitis due to pollen: Secondary | ICD-10-CM

## 2024-05-24 DIAGNOSIS — Z79899 Other long term (current) drug therapy: Secondary | ICD-10-CM

## 2024-05-24 DIAGNOSIS — Z01419 Encounter for gynecological examination (general) (routine) without abnormal findings: Secondary | ICD-10-CM | POA: Insufficient documentation

## 2024-05-24 DIAGNOSIS — D509 Iron deficiency anemia, unspecified: Secondary | ICD-10-CM

## 2024-05-24 LAB — LIPID PANEL

## 2024-05-24 MED ORDER — ATOMOXETINE HCL 40 MG PO CAPS: 40.0000 mg | ORAL_CAPSULE | Freq: Every day | ORAL | 3 refills | Status: AC

## 2024-05-24 MED ORDER — CETIRIZINE HCL 10 MG PO TABS: 10.0000 mg | ORAL_TABLET | Freq: Every day | ORAL | 3 refills | Status: AC

## 2024-05-24 MED ORDER — LORAZEPAM 0.5 MG PO TABS: 0.5000 mg | ORAL_TABLET | Freq: Two times a day (BID) | ORAL | 2 refills | Status: AC | PRN

## 2024-05-24 MED ORDER — BUPROPION HCL ER (XL) 300 MG PO TB24: 300.0000 mg | ORAL_TABLET | Freq: Every day | ORAL | 3 refills | Status: AC

## 2024-05-24 MED ORDER — ESCITALOPRAM OXALATE 10 MG PO TABS: 10.0000 mg | ORAL_TABLET | Freq: Every day | ORAL | 3 refills | Status: AC

## 2024-05-24 MED ORDER — FERROUS SULFATE 325 (65 FE) MG PO TABS: 325.0000 mg | ORAL_TABLET | Freq: Two times a day (BID) | ORAL | 3 refills | Status: AC

## 2024-05-24 NOTE — Patient Instructions (Signed)
 Pap Test: What to Know Why am I having this test? A Pap test, also called a Pap smear, is a screening test to check for signs of: Infection. Cancer of the cervix. The cervix is the lowest part of the uterus. Precancerous changes. These are changes that may be a sign that cancer is developing. Females need this test regularly. In general, you should have a Pap test every 3 years until you reach menopause or you are 42 years old. If you are 59-26 years old you may choose to have their Pap test done at the same time as an human papillomavirus (HPV) test every 5 years instead of every 3 years. Your health care provider may recommend having Pap tests more or less often depending on your medical conditions and past Pap test results. What is being tested? Cervical cells are tested for signs of infection or abnormalities. What kind of sample is taken?  Your provider will collect a sample of cells from the surface of your cervix. This will be done using a small cotton swab, plastic spatula, or brush that is inserted into your vagina using a tool called a speculum. This sample is often collected during a pelvic exam, when you are lying on your back on an exam table with your feet in footrests, called stirrups. In some cases, fluids (secretions) from the cervix or vagina may also be collected. How do I prepare for this test? Know where you are in your menstrual cycle. If you're menstruating on the day of the test, you may be asked to reschedule. You may need to reschedule if you have a known vaginal infection on the day of the test. Follow instructions from your provider about: Changing or stopping your regular medicines. Some medicines, such as vaginal medicines and tetracycline, can cause abnormal test results. Avoiding douching 2-3 days before or the day of the test. Tell a health care provider about: Any allergies you have. All medicines you take. These include vitamins, herbs, eye drops, and  creams. Any bleeding problems you have. Any surgeries you've had. Any medical problems you have. Whether you're pregnant or may be pregnant. How are the results reported? Your test results will be reported as either abnormal or normal. What do the results mean? A normal test result means that you do not have signs of cancer of the cervix. An abnormal result may mean that you have: Cancer. A Pap test by itself is not enough to diagnose cancer. You will have more tests done if cancer is suspected. Precancerous changes in your cervix. Inflammation of the cervix. A sexually transmitted infection (STI). A fungal infection. An infection from a parasite. Talk with your provider about what your results mean. More tests may be needed. Questions to ask your health care provider Ask your provider, or the department that is doing the test: When will my results be ready? How will I get my results? What are my treatment options? What other tests do I need? What are my next steps? This information is not intended to replace advice given to you by your health care provider. Make sure you discuss any questions you have with your health care provider. Document Revised: 07/18/2023 Document Reviewed: 07/18/2023 Elsevier Patient Education  2025 [employer].

## 2024-05-24 NOTE — Progress Notes (Signed)
 "  Kayla Shepherd is a 44 y.o. female presents to office today for annual physical exam examination.    Patient arrives to appointment alone.  She reports that overall she has been doing well.  Has some intermittent episodes of low mood, particularly at nighttime because they do not leave the home at nighttime.  She notes Christmas was little difficult with the absence of her mother but she felt well supported by her family.  She was able to spend time with her family.  She utilizes the Ativan  extremely sparingly.  Still has not utilized everything that was prescribed last year but would like to have a new refill.  Compliant with Wellbutrin , Lexapro , Strattera  and all meds seem to be working well otherwise.  Compliant with ferrous sulfate  twice daily.  She does report that menstrual cycles tend to be a little erratic and maybe she gets 1 every other month and sometimes it is really heavy.  But overall things are still better than when she was on the OCP.  She is not sexually active.  She smokes no tobacco and drinks no alcohol.  Denies any family changes to medical history.  Has mammogram scheduled soon  Health Maintenance Due  Topic Date Due   Hepatitis B Vaccines 19-59 Average Risk (1 of 3 - 19+ 3-dose series) Never done   HPV VACCINES (1 - Risk 3-dose SCDM series) Never done   Cervical Cancer Screening (HPV/Pap Cotest)  Never done   COVID-19 Vaccine (4 - 2025-26 season) 01/11/2024    Immunization History  Administered Date(s) Administered   Influenza, Seasonal, Injecte, Preservative Fre 05/08/2023   Influenza,inj,Quad PF,6+ Mos 03/26/2016, 03/18/2017, 03/17/2018, 03/23/2019, 06/01/2020, 03/15/2021, 04/01/2022   Influenza-Unspecified 02/27/2024   Moderna Sars-Covid-2 Vaccination 08/16/2019, 09/13/2019, 05/22/2020   Tdap 09/29/2023   Past Medical History:  Diagnosis Date   ADD (attention deficit disorder)    Depression    Social History   Socioeconomic History   Marital status: Single     Spouse name: Not on file   Number of children: 0   Years of education: Not on file   Highest education level: Not on file  Occupational History   Occupation: disability  Tobacco Use   Smoking status: Never   Smokeless tobacco: Never  Vaping Use   Vaping status: Never Used  Substance and Sexual Activity   Alcohol use: No   Drug use: No   Sexual activity: Not on file  Other Topics Concern   Not on file  Social History Narrative   Lives with her parents   Social Drivers of Health   Tobacco Use: Low Risk (05/24/2024)   Patient History    Smoking Tobacco Use: Never    Smokeless Tobacco Use: Never    Passive Exposure: Not on file  Financial Resource Strain: Low Risk (10/27/2023)   Overall Financial Resource Strain (CARDIA)    Difficulty of Paying Living Expenses: Not hard at all  Food Insecurity: No Food Insecurity (10/27/2023)   Epic    Worried About Programme Researcher, Broadcasting/film/video in the Last Year: Never true    Ran Out of Food in the Last Year: Never true  Transportation Needs: No Transportation Needs (10/27/2023)   Epic    Lack of Transportation (Medical): No    Lack of Transportation (Non-Medical): No  Physical Activity: Sufficiently Active (10/27/2023)   Exercise Vital Sign    Days of Exercise per Week: 7 days    Minutes of Exercise per Session: 120 min  Stress: Stress Concern Present (10/27/2023)   [employer] of Occupational Health - Occupational Stress Questionnaire    Feeling of Stress: Rather much  Social Connections: Moderately Isolated (10/27/2023)   Social Connection and Isolation Panel    Frequency of Communication with Friends and Family: More than three times a week    Frequency of Social Gatherings with Friends and Family: More than three times a week    Attends Religious Services: More than 4 times per year    Active Member of [employer] or Organizations: No    Attends Banker Meetings: Never    Marital Status: Never married  Intimate Partner Violence:  Not At Risk (10/27/2023)   Epic    Fear of Current or Ex-Partner: No    Emotionally Abused: No    Physically Abused: No    Sexually Abused: No  Depression (PHQ2-9): Low Risk (05/24/2024)   Depression (PHQ2-9)    PHQ-2 Score: 0  Alcohol Screen: Low Risk (10/27/2023)   Alcohol Screen    Last Alcohol Screening Score (AUDIT): 0  Housing: Unknown (10/27/2023)   Epic    Unable to Pay for Housing in the Last Year: No    Number of Times Moved in the Last Year: Not on file    Homeless in the Last Year: No  Utilities: Not At Risk (10/27/2023)   Epic    Threatened with loss of utilities: No  Health Literacy: Adequate Health Literacy (10/27/2023)   B1300 Health Literacy    Frequency of need for help with medical instructions: Never   History reviewed. No pertinent surgical history. Family History  Problem Relation Age of Onset   Heart attack Mother    Hyperlipidemia Father    Dementia Father        MCI   Current Medications[1]  Allergies[2]   ROS: Review of Systems Pertinent items noted in HPI and remainder of comprehensive ROS otherwise negative.    Physical exam BP (!) 147/84   Pulse (!) 132   Temp 97.7 F (36.5 C) (Oral)   Ht 5' 0.5 (1.537 m)   Wt 154 lb 3.2 oz (69.9 kg)   SpO2 100%   BMI 29.62 kg/m  General appearance: Alert, cooperative.  Anxious appearing. Head: Normocephalic, without obvious abnormality, atraumatic Eyes: negative findings: lids and lashes normal, conjunctivae and sclerae normal, corneas clear, and pupils equal, round, reactive to light and accomodation Ears: normal TM's and external ear canals both ears Nose: Nares normal. Septum midline. Mucosa normal. No drainage or sinus tenderness. Throat: lips, mucosa, and tongue normal; teeth and gums normal Neck: no adenopathy, no carotid bruit, supple, symmetrical, trachea midline, and thyroid  not enlarged, symmetric, no tenderness/mass/nodules Back: symmetric, no curvature. ROM normal. No CVA  tenderness. Lungs: clear to auscultation bilaterally Heart: regular rate and rhythm, S1, S2 normal, no murmur, click, rub or gallop Abdomen: soft, non-tender; bowel sounds normal; no masses,  no organomegaly Pelvic: external genitalia normal, no adnexal masses or tenderness, rectovaginal septum normal, vagina normal without discharge, and limited exam due to anxiety surrounding pelvic exam.  No gross discharge or abnormalities appreciated on limited exam.  Only outer rim of cervix appreciated Extremities: extremities normal, atraumatic, no cyanosis or edema Pulses: 2+ and symmetric Skin: Skin color, texture, turgor normal. No rashes or lesions Lymph nodes: No supraclavicular or anterior cervical lymphadenopathy Neurologic: Slight cognitive delay.  Follows commands otherwise.  Mentation somewhat younger than stated age     05/24/2024    2:07 PM 04/12/2024  2:18 PM 04/04/2024   11:38 AM  Depression screen PHQ 2/9  Decreased Interest 0 0 0  Down, Depressed, Hopeless 0 0 0  PHQ - 2 Score 0 0 0  Altered sleeping   0  Tired, decreased energy   0  Change in appetite   0  Feeling bad or failure about yourself    0  Trouble concentrating   0  Moving slowly or fidgety/restless   0  Suicidal thoughts   0  PHQ-9 Score   0  Difficult doing work/chores   Not difficult at all      05/24/2024    2:08 PM 04/04/2024   11:38 AM 09/29/2023   11:45 AM 12/03/2021    5:24 PM  GAD 7 : Generalized Anxiety Score  Nervous, Anxious, on Edge 0 0 0 0  Control/stop worrying 0 0 0 0  Worry too much - different things 0 0 0 0  Trouble relaxing 0 0 0 0  Restless 0 0 0 0  Easily annoyed or irritable 0 0 0 0  Afraid - awful might happen 0 0 0 0  Total GAD 7 Score 0 0 0 0  Anxiety Difficulty Not difficult at all Not difficult at all Not difficult at all Not difficult at all    No results found for this or any previous visit (from the past 2160 hours).   Assessment/ Plan: Rosina FORBES Meissner here for annual  physical exam.   Annual physical exam  Screening for malignant neoplasm of cervix - Plan: Cytology - PAP  Iron  deficiency anemia, unspecified iron  deficiency anemia type - Plan: ferrous sulfate  (FEROSUL) 325 (65 FE) MG tablet, CBC with Differential, Iron , TIBC and Ferritin Panel  Pure hypercholesterolemia - Plan: CMP14+EGFR, TSH, Lipid Panel  Attention deficit hyperactivity disorder (ADHD), predominantly inattentive type - Plan: atomoxetine  (STRATTERA ) 40 MG capsule, CMP14+EGFR  Anxiety - Plan: buPROPion  (WELLBUTRIN  XL) 300 MG 24 hr tablet, escitalopram  (LEXAPRO ) 10 MG tablet, LORazepam  (ATIVAN ) 0.5 MG tablet, ToxASSURE Select 13 (MW), Urine  Seasonal allergic rhinitis due to pollen - Plan: cetirizine  (ZYRTEC  ALLERGY) 10 MG tablet  Controlled substance agreement signed - Plan: LORazepam  (ATIVAN ) 0.5 MG tablet, ToxASSURE Select 13 (MW), Urine   Pap smear completed.  Exam was technically difficult due to patient's fear of the Pap.  No abnormalities visualized on limited exam.  Nonfasting labs collected.  All medications renewed including Ativan .  UDS and CSA were updated as per office policy the national narcotic database reviewed with no red flags.  Counseled on healthy lifestyle choices, including diet (rich in fruits, vegetables and lean meats and low in salt and simple carbohydrates) and exercise (at least 30 minutes of moderate physical activity daily).  Patient to follow up 1 year, sooner if concerns arise  Zarinah Oviatt M. Dejuan Elman, DO        [1]  Current Outpatient Medications:    albuterol  (VENTOLIN  HFA) 108 (90 Base) MCG/ACT inhaler, Inhale 2 puffs into the lungs every 6 (six) hours as needed for wheezing or shortness of breath., Disp: 8 g, Rfl: 0   atomoxetine  (STRATTERA ) 40 MG capsule, Take 1 capsule (40 mg total) by mouth daily., Disp: 90 capsule, Rfl: 3   buPROPion  (WELLBUTRIN  XL) 300 MG 24 hr tablet, Take 1 tablet (300 mg total) by mouth daily., Disp: 90 tablet, Rfl: 3    cetirizine  (ZYRTEC  ALLERGY) 10 MG tablet, Take 1 tablet (10 mg total) by mouth daily., Disp: 90 tablet, Rfl: 3   escitalopram  (LEXAPRO )  10 MG tablet, Take 1 tablet (10 mg total) by mouth daily., Disp: 90 tablet, Rfl: 3   ferrous sulfate  (FEROSUL) 325 (65 FE) MG tablet, Take 1 tablet (325 mg total) by mouth 2 (two) times daily with a meal., Disp: 180 tablet, Rfl: 3   fluticasone  (FLONASE ) 50 MCG/ACT nasal spray, Place 2 sprays into both nostrils daily., Disp: 16 g, Rfl: 6   LORazepam  (ATIVAN ) 0.5 MG tablet, Take 1 tablet (0.5 mg total) by mouth 2 (two) times daily as needed for anxiety., Disp: 15 tablet, Rfl: 2 [2] No Known Allergies  "

## 2024-05-25 LAB — CBC WITH DIFFERENTIAL/PLATELET
Basophils Absolute: 0.1 x10E3/uL (ref 0.0–0.2)
Basos: 1 %
EOS (ABSOLUTE): 0.1 x10E3/uL (ref 0.0–0.4)
Eos: 1 %
Hematocrit: 43.9 % (ref 34.0–46.6)
Hemoglobin: 14.7 g/dL (ref 11.1–15.9)
Immature Grans (Abs): 0 x10E3/uL (ref 0.0–0.1)
Immature Granulocytes: 0 %
Lymphocytes Absolute: 2 x10E3/uL (ref 0.7–3.1)
Lymphs: 31 %
MCH: 33 pg (ref 26.6–33.0)
MCHC: 33.5 g/dL (ref 31.5–35.7)
MCV: 98 fL — ABNORMAL HIGH (ref 79–97)
Monocytes Absolute: 0.5 x10E3/uL (ref 0.1–0.9)
Monocytes: 8 %
Neutrophils Absolute: 3.9 x10E3/uL (ref 1.4–7.0)
Neutrophils: 59 %
Platelets: 432 x10E3/uL (ref 150–450)
RBC: 4.46 x10E6/uL (ref 3.77–5.28)
RDW: 11.9 % (ref 11.7–15.4)
WBC: 6.6 x10E3/uL (ref 3.4–10.8)

## 2024-05-25 LAB — CMP14+EGFR
ALT: 9 IU/L (ref 0–32)
AST: 16 IU/L (ref 0–40)
Albumin: 4.6 g/dL (ref 3.9–4.9)
Alkaline Phosphatase: 89 IU/L (ref 41–116)
BUN/Creatinine Ratio: 6 — AB (ref 9–23)
BUN: 5 mg/dL — AB (ref 6–24)
Bilirubin Total: 0.3 mg/dL (ref 0.0–1.2)
CO2: 22 mmol/L (ref 20–29)
Calcium: 9.3 mg/dL (ref 8.7–10.2)
Chloride: 102 mmol/L (ref 96–106)
Creatinine, Ser: 0.9 mg/dL (ref 0.57–1.00)
Globulin, Total: 2.9 g/dL (ref 1.5–4.5)
Glucose: 99 mg/dL (ref 70–99)
Potassium: 3.9 mmol/L (ref 3.5–5.2)
Sodium: 139 mmol/L (ref 134–144)
Total Protein: 7.5 g/dL (ref 6.0–8.5)
eGFR: 82 mL/min/1.73

## 2024-05-25 LAB — LIPID PANEL
Cholesterol, Total: 229 mg/dL — AB (ref 100–199)
HDL: 41 mg/dL
LDL CALC COMMENT:: 5.6 ratio — AB (ref 0.0–4.4)
LDL Chol Calc (NIH): 168 mg/dL — AB (ref 0–99)
Triglycerides: 112 mg/dL (ref 0–149)
VLDL Cholesterol Cal: 20 mg/dL (ref 5–40)

## 2024-05-25 LAB — IRON,TIBC AND FERRITIN PANEL
Ferritin: 21 ng/mL (ref 15–150)
Iron Saturation: 29 % (ref 15–55)
Iron: 95 ug/dL (ref 27–159)
Total Iron Binding Capacity: 324 ug/dL (ref 250–450)
UIBC: 229 ug/dL (ref 131–425)

## 2024-05-25 LAB — TSH: TSH: 2.84 u[IU]/mL (ref 0.450–4.500)

## 2024-05-26 ENCOUNTER — Ambulatory Visit
Admission: RE | Admit: 2024-05-26 | Discharge: 2024-05-26 | Disposition: A | Source: Ambulatory Visit | Attending: Family Medicine

## 2024-05-26 DIAGNOSIS — Z1231 Encounter for screening mammogram for malignant neoplasm of breast: Secondary | ICD-10-CM

## 2024-05-27 ENCOUNTER — Ambulatory Visit: Payer: Self-pay | Admitting: Family Medicine

## 2024-05-27 LAB — CYTOLOGY - PAP
Comment: NEGATIVE
Diagnosis: UNDETERMINED — AB
High risk HPV: NEGATIVE

## 2024-06-03 LAB — TOXASSURE SELECT 13 (MW), URINE

## 2024-10-27 ENCOUNTER — Ambulatory Visit: Payer: Self-pay

## 2024-11-15 ENCOUNTER — Ambulatory Visit

## 2024-12-13 ENCOUNTER — Encounter: Admitting: Family Medicine

## 2025-05-24 ENCOUNTER — Encounter: Admitting: Family Medicine
